# Patient Record
Sex: Male | Born: 1975
Health system: Southern US, Community
[De-identification: ages and names within clinical notes are randomized; demographics above are authoritative.]

## PROBLEM LIST (undated history)

## (undated) DIAGNOSIS — K219 Gastro-esophageal reflux disease without esophagitis: Secondary | ICD-10-CM

## (undated) DIAGNOSIS — F419 Anxiety disorder, unspecified: Secondary | ICD-10-CM

## (undated) DIAGNOSIS — R569 Unspecified convulsions: Secondary | ICD-10-CM

## (undated) DIAGNOSIS — F32A Depression, unspecified: Secondary | ICD-10-CM

## (undated) DIAGNOSIS — F329 Major depressive disorder, single episode, unspecified: Secondary | ICD-10-CM

## (undated) HISTORY — DX: Depression, unspecified: F32.A

## (undated) HISTORY — DX: Major depressive disorder, single episode, unspecified: F32.9

## (undated) HISTORY — DX: Gastro-esophageal reflux disease without esophagitis: K21.9

## (undated) HISTORY — DX: Anxiety disorder, unspecified: F41.9

---

## 1995-07-04 HISTORY — PX: FOOT SURGERY: SHX648

## 2012-07-22 ENCOUNTER — Ambulatory Visit: Payer: BC Managed Care – PPO | Admitting: Emergency Medicine

## 2012-07-22 VITALS — BP 111/77 | HR 103 | Temp 97.9°F | Resp 18 | Ht 68.5 in | Wt 223.4 lb

## 2012-07-22 DIAGNOSIS — Z20828 Contact with and (suspected) exposure to other viral communicable diseases: Secondary | ICD-10-CM

## 2012-07-22 DIAGNOSIS — F411 Generalized anxiety disorder: Secondary | ICD-10-CM

## 2012-07-22 DIAGNOSIS — F419 Anxiety disorder, unspecified: Secondary | ICD-10-CM

## 2012-07-22 MED ORDER — TEMAZEPAM 30 MG PO CAPS: 30.0000 mg | ORAL_CAPSULE | Freq: Every evening | ORAL | Status: DC | PRN

## 2012-07-22 MED ORDER — OSELTAMIVIR PHOSPHATE 75 MG PO CAPS: 75.0000 mg | ORAL_CAPSULE | Freq: Two times a day (BID) | ORAL | Status: DC

## 2012-07-22 NOTE — Patient Instructions (Addendum)

## 2012-07-22 NOTE — Progress Notes (Signed)
[facility] [address] [place] [address] [phone]  Date:  07/22/2012   Name:  [name] [name]   DOB:  [birth year]   MRN:  [id number]  PCP:  No primary provider on file.    Chief Complaint: Insomnia   History of Present Illness:  [name] [name] is a 37 y.o. very pleasant male patient who presents with the following:  Wife was treated for influenza today and he was advised to receive prophylaxis with tamiflu.  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  Difficulty falling asleep.  There is no problem list on file for this patient.   History reviewed. No pertinent past medical history.  History reviewed. No pertinent past surgical history.  History  Substance Use Topics  . Smoking status: Current Some Day Smoker  . Smokeless tobacco: Never Used  . Alcohol Use: Yes     Comment: social    No family history on file.  Not on File  Medication list has been reviewed and updated.  No current outpatient prescriptions on file prior to visit.    Review of Systems:  As per HPI, otherwise negative.    Physical Examination: Filed Vitals:   07/22/12 2047  BP: 111/77  Pulse: 103  Temp: 97.9 F (36.6 C)  Resp: 18   Filed Vitals:   07/22/12 2047  Height: 5' 8.5" (1.74 m)  Weight: 223 lb 6.4 oz (101.334 kg)   Body mass index is 33.47 kg/(m^2). Ideal Body Weight: Weight in (lb) to have BMI = 25: 166.5    GEN: WDWN, NAD, Non-toxic, Alert & Oriented x 3 HEENT: Atraumatic, Normocephalic.  Ears and Nose: No external deformity. EXTR: No clubbing/cyanosis/edema NEURO: Normal gait.  PSYCH: Normally interactive. Conversant. Not depressed or anxious appearing.  Calm demeanor.     Assessment and Plan: Insomnia Flu exposure restoril tamiflu  Carmelina Dane, MD

## 2013-03-17 ENCOUNTER — Other Ambulatory Visit: Payer: Self-pay | Admitting: Emergency Medicine

## 2013-03-21 ENCOUNTER — Telehealth: Payer: Self-pay

## 2013-03-21 NOTE — Telephone Encounter (Signed)
Patient advised.

## 2013-03-21 NOTE — Telephone Encounter (Signed)
Pt called Monday he said that he is needing refill on his sleeping pills bc the prescription he did have ran out pharmacy is [facility] [facility] [address] pt Call back number is [phone]

## 2013-03-21 NOTE — Telephone Encounter (Signed)
Refill request was denied because he needs to be seen.  Last seen in January.

## 2013-03-31 ENCOUNTER — Emergency Department (HOSPITAL_COMMUNITY)
Admission: EM | Admit: 2013-03-31 | Discharge: 2013-03-31 | Disposition: A | Discharge status: Home / Self Care | Payer: BC Managed Care – PPO | Attending: Emergency Medicine | Admitting: Emergency Medicine

## 2013-03-31 ENCOUNTER — Emergency Department (HOSPITAL_COMMUNITY): Payer: BC Managed Care – PPO

## 2013-03-31 ENCOUNTER — Encounter (HOSPITAL_COMMUNITY): Payer: Self-pay | Admitting: Emergency Medicine

## 2013-03-31 DIAGNOSIS — Z79899 Other long term (current) drug therapy: Secondary | ICD-10-CM | POA: Insufficient documentation

## 2013-03-31 DIAGNOSIS — F17200 Nicotine dependence, unspecified, uncomplicated: Secondary | ICD-10-CM | POA: Insufficient documentation

## 2013-03-31 DIAGNOSIS — R110 Nausea: Secondary | ICD-10-CM | POA: Insufficient documentation

## 2013-03-31 DIAGNOSIS — R1031 Right lower quadrant pain: Secondary | ICD-10-CM

## 2013-03-31 DIAGNOSIS — N509 Disorder of male genital organs, unspecified: Secondary | ICD-10-CM | POA: Insufficient documentation

## 2013-03-31 DIAGNOSIS — R109 Unspecified abdominal pain: Secondary | ICD-10-CM | POA: Insufficient documentation

## 2013-03-31 LAB — BASIC METABOLIC PANEL
BUN: 12 mg/dL (ref 6–23)
Calcium: 10 mg/dL (ref 8.4–10.5)
Chloride: 99 mEq/L (ref 96–112)
Creatinine, Ser: 0.94 mg/dL (ref 0.50–1.35)
GFR calc Af Amer: >90 mL/min (ref >90)
Glucose, Bld: 114 mg/dL — ABNORMAL HIGH (ref 70–99)

## 2013-03-31 LAB — CBC WITH DIFFERENTIAL/PLATELET
Basophils Absolute: 0 10*3/uL (ref 0.0–0.1)
Basophils Relative: 0 % (ref 0–1)
Eosinophils Absolute: 0.1 10*3/uL (ref 0.0–0.7)
Eosinophils Relative: 1 % (ref 0–5)
HCT: 44.6 % (ref 39.0–52.0)
Lymphocytes Relative: 14 % (ref 12–46)
MCHC: 34.1 g/dL (ref 30.0–36.0)
Monocytes Absolute: 0.9 10*3/uL (ref 0.1–1.0)
Neutro Abs: 10.4 10*3/uL — ABNORMAL HIGH (ref 1.7–7.7)
Platelets: 179 10*3/uL (ref 150–400)
RDW: 12.8 % (ref 11.5–15.5)
WBC: 13.2 10*3/uL — ABNORMAL HIGH (ref 4.0–10.5)

## 2013-03-31 LAB — URINALYSIS, ROUTINE W REFLEX MICROSCOPIC
Bilirubin Urine: NEGATIVE
Ketones, ur: NEGATIVE mg/dL
Leukocytes, UA: NEGATIVE
Nitrite: NEGATIVE
Protein, ur: NEGATIVE mg/dL
Urobilinogen, UA: 0.2 mg/dL (ref 0.0–1.0)
pH: 6.5 (ref 5.0–8.0)

## 2013-03-31 MED ORDER — KETOROLAC TROMETHAMINE 30 MG/ML IJ SOLN
30.0000 mg | Freq: Once | INTRAMUSCULAR | Status: AC
  Administered 2013-03-31: 30 mg via INTRAVENOUS
  Filled 2013-03-31: qty 1

## 2013-03-31 MED ORDER — OXYCODONE-ACETAMINOPHEN 5-325 MG PO TABS
1.0000 | ORAL_TABLET | Freq: Once | ORAL | Status: AC
  Administered 2013-03-31: 1 via ORAL
  Filled 2013-03-31: qty 1

## 2013-03-31 MED ORDER — OXYCODONE-ACETAMINOPHEN 5-325 MG PO TABS: 1.0000 | ORAL_TABLET | ORAL | Status: DC | PRN

## 2013-03-31 MED ORDER — CYCLOBENZAPRINE HCL 10 MG PO TABS: 10.0000 mg | ORAL_TABLET | Freq: Two times a day (BID) | ORAL | Status: DC | PRN

## 2013-03-31 MED ORDER — IBUPROFEN 600 MG PO TABS: 600.0000 mg | ORAL_TABLET | Freq: Three times a day (TID) | ORAL | Status: DC | PRN

## 2013-03-31 MED ORDER — SODIUM CHLORIDE 0.9 % IV SOLN
1000.0000 mL | INTRAVENOUS | Status: DC
  Administered 2013-03-31: 1000 mL via INTRAVENOUS

## 2013-03-31 MED ORDER — MORPHINE SULFATE 4 MG/ML IJ SOLN
6.0000 mg | Freq: Once | INTRAMUSCULAR | Status: AC
  Administered 2013-03-31: 6 mg via INTRAVENOUS
  Filled 2013-03-31: qty 2

## 2013-03-31 MED ORDER — ONDANSETRON HCL 4 MG/2ML IJ SOLN
4.0000 mg | Freq: Once | INTRAMUSCULAR | Status: AC
  Administered 2013-03-31: 4 mg via INTRAVENOUS
  Filled 2013-03-31: qty 2

## 2013-03-31 MED ORDER — DIAZEPAM 5 MG PO TABS
5.0000 mg | ORAL_TABLET | Freq: Once | ORAL | Status: AC
  Administered 2013-03-31: 5 mg via ORAL
  Filled 2013-03-31: qty 1

## 2013-03-31 MED ORDER — HYDROMORPHONE HCL PF 1 MG/ML IJ SOLN
1.0000 mg | Freq: Once | INTRAMUSCULAR | Status: AC
  Administered 2013-03-31: 1 mg via INTRAVENOUS
  Filled 2013-03-31: qty 1

## 2013-03-31 MED ORDER — SODIUM CHLORIDE 0.9 % IV SOLN
1000.0000 mL | Freq: Once | INTRAVENOUS | Status: AC
  Administered 2013-03-31: 1000 mL via INTRAVENOUS

## 2013-03-31 NOTE — ED Notes (Addendum)
Patient states that he began feeling groin pain at 0000 this am, patient went to sleep after intercourse with his wife, patient woke up at 0300 with right sided groin pain 10/10. Denies flank pain, denies heavy lifting this weekend. Patient denies dysuria, states he has been urinating at night more frequently for the past 2 months.

## 2013-03-31 NOTE — ED Notes (Signed)
Bed: ZO10 Expected date:  Expected time:  Means of arrival:  Comments: ems- leg pain, urinary frequency

## 2013-03-31 NOTE — Progress Notes (Addendum)
   CARE MANAGEMENT ED NOTE 03/31/2013  Patient:  [name] [name]   Account Number:  [id number]  Date Initiated:  03/31/2013  Documentation initiated by:  Edd Arbour  Subjective/Objective Assessment:   37 yr old male [employer] No pcp listed in EPIC     Subjective/Objective Assessment Detail:     Action/Plan:   Action/Plan Detail:   CM spoke with pt who confirmed no pcp   Anticipated DC Date:  03/31/2013     Status Recommendation to Physician:   Result of Recommendation:    Other ED Services  Consult Working Plan    DC Planning Services  Other  Outpatient Services - Pt will follow up  PCP issues    Choice offered to / List presented to:            Status of service:  Completed, signed off  ED Comments:   ED Comments Detail:  [facility] ED CM spoke with pt on how to obtain an in network pcp with insurance coverage via the customer service number or web site Cm reviewed ED level of care for crisis/emergent services and community pcp level of care to manage continuous or chronic medical concerns.  The pt voiced understanding CM encouraged pt and discussed pt's responsibility to verify with pt's insurance carrier that any recommended medical provider offered by any emergency room or a hospital provider is within the carrier's network. The pt voiced understanding

## 2013-03-31 NOTE — ED Provider Notes (Signed)
CSN: [id number]     Arrival date & time 03/31/13  0806 History   First MD Initiated Contact with Patient 03/31/13 [phone]     Chief Complaint  Patient presents with  . Groin Pain    HPI Patient reports severe right groin and right testicular pain that began this morning.  He became nauseated because of the pain.  She's never had pain like this before.  No pain with urination burning with urination.  No lower abdominal pain.  He denies right flank pain.  No history kidney stones.  He does report oral intercourse last night but no abnormal ejaculation.  No blood in his ejaculate.  No other complaints at that time.  He does state that he awoke several hours later with severe pain down his right groin and right thigh region.  He reports his pain is worse with walking and his pain is worse with flexion of his right hip.  He also reports the pain is worse with palpation of his right testicle and right groin region.  No known history of inguinal hernias.  Spasmodic to severe in severity at this time.  No fevers or chills or   History reviewed. No pertinent past medical history. History reviewed. No pertinent past surgical history. History reviewed. No pertinent family history. History  Substance Use Topics  . Smoking status: Current Some Day Smoker  . Smokeless tobacco: Never Used  . Alcohol Use: Yes     Comment: social    Review of Systems  All other systems reviewed and are negative.    Allergies  Review of patient's allergies indicates no known allergies.  Home Medications   Current Outpatient Rx  Name  Route  Sig  Dispense  Refill  . MELATONIN PO   Oral   Take 60 mg by mouth at bedtime.         Marland Kitchen omeprazole (PRILOSEC) 40 MG capsule   Oral   Take 40 mg by mouth daily as needed (hearburn).         . cyclobenzaprine (FLEXERIL) 10 MG tablet   Oral   Take 1 tablet (10 mg total) by mouth 2 (two) times daily as needed for muscle spasms.   20 tablet   0   . ibuprofen  (ADVIL,MOTRIN) 600 MG tablet   Oral   Take 1 tablet (600 mg total) by mouth every 8 (eight) hours as needed for pain.   15 tablet   0   . oxyCODONE-acetaminophen (PERCOCET/ROXICET) 5-325 MG per tablet   Oral   Take 1 tablet by mouth every 4 (four) hours as needed for pain.   20 tablet   0    BP 103/89  Pulse 74  Temp(Src) 98.9 F (37.2 C)  Resp 24  SpO2 100% Physical Exam  Nursing note and vitals reviewed. Constitutional: He is oriented to person, place, and time. He appears well-developed and well-nourished.  HENT:  Head: Normocephalic and atraumatic.  Eyes: EOM are normal.  Neck: Normal range of motion.  Cardiovascular: Normal rate, regular rhythm, normal heart sounds and intact distal pulses.   Pulmonary/Chest: Effort normal and breath sounds normal. No respiratory distress.  Abdominal: Soft. He exhibits no distension. There is no tenderness.  Genitourinary:  Mild tenderness of his right testicle without swelling.  No overlying scrotal changes.  No inguinal hernias present on examination.  Patient was exquisite tenderness of his right medial thigh with tenderness along the sartorius and gracilis muscles.  Pain at the insertion of the  screw still is in his right suprapubic region.  No right thigh erythema or crepitus.  No perineal issues noted.  Musculoskeletal: Normal range of motion.  Neurological: He is alert and oriented to person, place, and time.  Skin: Skin is warm and dry.  Psychiatric: He has a normal mood and affect. Judgment normal.    ED Course  Procedures (including critical care time) Labs Review Labs Reviewed  BASIC METABOLIC PANEL - Abnormal; Notable for the following:    Glucose, Bld 114 (*)    All other components within normal limits  CBC WITH DIFFERENTIAL - Abnormal; Notable for the following:    WBC 13.2 (*)    Neutrophils Relative % 79 (*)    Neutro Abs 10.4 (*)    All other components within normal limits  URINALYSIS, ROUTINE W REFLEX  MICROSCOPIC   Imaging Review US Scrotum  03/31/2013   *RADIOLOGY REPORT*  Clinical Data:  37 year old male with groin pain, right greater than left  SCROTAL ULTRASOUND DOPPLER ULTRASOUND OF THE TESTICLES  Technique: Complete ultrasound examination of the testicles, epididymis, and other scrotal structures was performed.  Color and spectral Doppler ultrasound were also utilized to evaluate blood flow to the testicles.  Comparison:  None  Findings:  Right testis:  The right testicle is normal in echogenicity and size measuring 5.5 x 2.7 x 3.1 cm.  No focal testicular abnormalities are noted.  Normal color Doppler flow and arterial/venous waveforms are noted.  Left testis:  The left testicle is normal in echogenicity and size measuring 5 x 2.4 x 3.1 cm.  No focal testicular abnormalities are noted.  Normal color Doppler flow and arterial/venous waveforms are noted.  Right epididymis:  Normal  Left epididymis:  A 4 x 5 mm epididymal cyst/spermatocele is noted.  Hydrocele:  Absent  Varicocele:  Absent  Pulsed Doppler interrogation of both testes demonstrates low resistance flow bilaterally.  IMPRESSION: Unremarkable scrotal ultrasound.  No evidence of testicular torsion or focal testicular abnormality.   Original Report Authenticated By: Harmon Pier, M.D.   [place] Art/ven Flow Abd Pelv Doppler  03/31/2013   *RADIOLOGY REPORT*  Clinical Data:  37 year old male with groin pain, right greater than left  SCROTAL ULTRASOUND DOPPLER ULTRASOUND OF THE TESTICLES  Technique: Complete ultrasound examination of the testicles, epididymis, and other scrotal structures was performed.  Color and spectral Doppler ultrasound were also utilized to evaluate blood flow to the testicles.  Comparison:  None  Findings:  Right testis:  The right testicle is normal in echogenicity and size measuring 5.5 x 2.7 x 3.1 cm.  No focal testicular abnormalities are noted.  Normal color Doppler flow and arterial/venous waveforms are noted.  Left  testis:  The left testicle is normal in echogenicity and size measuring 5 x 2.4 x 3.1 cm.  No focal testicular abnormalities are noted.  Normal color Doppler flow and arterial/venous waveforms are noted.  Right epididymis:  Normal  Left epididymis:  A 4 x 5 mm epididymal cyst/spermatocele is noted.  Hydrocele:  Absent  Varicocele:  Absent  Pulsed Doppler interrogation of both testes demonstrates low resistance flow bilaterally.  IMPRESSION: Unremarkable scrotal ultrasound.  No evidence of testicular torsion or focal testicular abnormality.   Original Report Authenticated By: Harmon Pier, M.D.  I personally reviewed the imaging tests through PACS system I reviewed available ER/hospitalization records through the EMR   MDM   1. Deep groin pain, right    Patient was treated in the emergency department.  Ultrasound demonstrates  a normal right testicle.  No inguinal hernias present.  Doubt ureteral stone.  Normal pulses in right foot.  Doubt DVT.  I examined the patient on 3 different occasions to evaluate his right groin right testicular region including in the room with his significant other.  It seems as though this is more related to sartorius her Kristalose pulled muscle with significant pain with movement of his right medial thigh.  Doubt septic arthritis.  Patient understands return to the ER for new or worsening symptoms.  Orthopedic referral.  Pain significantly improved in emergency department    Lyanne Co, MD 03/31/13 1348

## 2013-03-31 NOTE — ED Notes (Addendum)
Patient's brother, ED: [phone]. If after 1700 discharge, send patient to [address] [place].

## 2013-09-12 ENCOUNTER — Encounter: Payer: Self-pay | Admitting: Internal Medicine

## 2013-09-16 ENCOUNTER — Encounter: Payer: Self-pay | Admitting: Internal Medicine

## 2013-10-03 ENCOUNTER — Ambulatory Visit: Payer: BC Managed Care – PPO | Admitting: Internal Medicine

## 2013-10-07 ENCOUNTER — Ambulatory Visit: Payer: BC Managed Care – PPO | Admitting: Internal Medicine

## 2013-10-07 DIAGNOSIS — Z0289 Encounter for other administrative examinations: Secondary | ICD-10-CM

## 2014-03-17 ENCOUNTER — Other Ambulatory Visit (INDEPENDENT_AMBULATORY_CARE_PROVIDER_SITE_OTHER): Payer: BC Managed Care – PPO

## 2014-03-17 ENCOUNTER — Encounter: Payer: Self-pay | Admitting: Internal Medicine

## 2014-03-17 ENCOUNTER — Ambulatory Visit (INDEPENDENT_AMBULATORY_CARE_PROVIDER_SITE_OTHER): Payer: BC Managed Care – PPO | Admitting: Internal Medicine

## 2014-03-17 VITALS — BP 118/82 | HR 77 | Temp 98.6°F | Resp 22 | Ht 69.0 in | Wt 221.4 lb

## 2014-03-17 DIAGNOSIS — R209 Unspecified disturbances of skin sensation: Secondary | ICD-10-CM

## 2014-03-17 DIAGNOSIS — N528 Other male erectile dysfunction: Secondary | ICD-10-CM

## 2014-03-17 DIAGNOSIS — R200 Anesthesia of skin: Secondary | ICD-10-CM | POA: Insufficient documentation

## 2014-03-17 DIAGNOSIS — G4700 Insomnia, unspecified: Secondary | ICD-10-CM | POA: Insufficient documentation

## 2014-03-17 DIAGNOSIS — G5600 Carpal tunnel syndrome, unspecified upper limb: Secondary | ICD-10-CM | POA: Insufficient documentation

## 2014-03-17 DIAGNOSIS — G5603 Carpal tunnel syndrome, bilateral upper limbs: Secondary | ICD-10-CM

## 2014-03-17 DIAGNOSIS — N529 Male erectile dysfunction, unspecified: Secondary | ICD-10-CM | POA: Insufficient documentation

## 2014-03-17 LAB — HEMOGLOBIN A1C: Hgb A1c MFr Bld: 5.9 % (ref 4.6–6.5)

## 2014-03-17 MED ORDER — ZOLPIDEM TARTRATE 5 MG PO TABS: 5.0000 mg | ORAL_TABLET | Freq: Every evening | ORAL | Status: DC | PRN

## 2014-03-17 MED ORDER — SILDENAFIL CITRATE 100 MG PO TABS: 50.0000 mg | ORAL_TABLET | Freq: Every day | ORAL | Status: DC | PRN
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
## 2014-03-17 NOTE — Assessment & Plan Note (Signed)
Closest patient can describe is numbness and may be from restless leg syndrome. He is very insistent that he is worried about blood flow with family history of stroke. Will check ABI but reassured him that he is not likely to have problems.

## 2014-03-17 NOTE — Progress Notes (Signed)
Pre visit review using our clinic review tool, if applicable. No additional management support is needed unless otherwise documented below in the visit note. 

## 2014-03-17 NOTE — Assessment & Plan Note (Addendum)
Likely psychogenic in nature. He does not always have problems with erections. Will rx viagra as I think this will help his mind to know that he has help if he needs it.
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
## 2014-03-17 NOTE — Patient Instructions (Signed)
We will check a test on you to measure the blood flow to your legs. We will also check your blood for diabetes.   We will give you a prescription for ambien to help you to sleep and for viagra to try if it is not too expensive.   Work on portion control to help lose about 5-10 pounds.   Serving Sizes What we call a serving size today is larger than it was in the past. A 1950s fast-food burger contained little more than 1 oz of meat, and a soft drink was 8 oz (1 cup). Today, a "quarter pounder" burger is at least 4 times that amount, and a 32 or 64 oz drink is not uncommon. A possible guide for eating when trying to lose weight is to eat about half as much as you normally do. Some estimates of serving sizes are:  1 Dairy serving:Individual container of yogurt (8 oz) or piece of cheese the size of your thumb (1 oz).  1 Grain serving: 1 slice of bread or  cup pasta.  1 Meat serving: The size of a deck of cards (3 oz).  1 Fruit serving: cup canned fruit or 1 medium fruit.  1 Vegetable serving:  cup of cooked or canned vegetables.  1 Fat serving:The size of 4 stacked dimes. Experts suggest spending 1 or 2 days measuring food portions you commonly eat. This will give you better practice at estimating serving sizes, and will also show whether you are eating an appropriate amount of food to meet your weight goals. If you find that you are eating more than you thought, try measuring your food for a few days so you can "reprogram" yourself to learn what makes a healthy portion for you. SUGGESTIONS FOR CONTROL  In restaurants, share entrees, or ask the waiter to put half the entre in a box or bag before you even touch it.  Order lunch-sized portions. Many restaurants serve 4 to 6 oz of meat at lunch, compared with 8 to 10 oz at dinner.  Split dessert or skip it all together. Have a piece of fruit when you get home.  At home, use smaller plates and bowls. It will look as if you are eating  more.  Plate your food in the kitchen rather than serving it "family style" at the table.  Wait 20 to 30 minutes before taking seconds. This is how long it takes your brain to recognize that you are full.  Check food labels for serving sizes. Eat 1 serving only.  Use measuring cups and spoons to see proper serving sizes.  Buy smaller packages of candy, popcorn, and snacks.  Avoid eating directly out of the bag or carton.  While eating half as much, exercise twice as much. Park further away from the mall, take the stairs instead of the escalator, and walk around your block. Losing weight is a slow, difficult process. It takes long-lasting lifestyle changes. You can make gradual changes over time so they become habits. Look to friends and family to support the healthy changes you are making. Avoid fad diets since they are often only temporary weight loss solutions. Document Released: 03/18/2003 Document Revised: 09/11/2011 Document Reviewed: 09/16/2013 [facility] Patient Information 2015 [place], [place]. This information is not intended to replace advice given to you by your health care provider. Make sure you discuss any questions you have with your health care provider.

## 2014-03-17 NOTE — Assessment & Plan Note (Signed)
Difficulty initiating sleep and will rx ambien. He does not have awakening in the middle of the night so will be ideal agent.

## 2014-03-19 ENCOUNTER — Other Ambulatory Visit: Payer: Self-pay | Admitting: Internal Medicine

## 2014-03-19 DIAGNOSIS — R200 Anesthesia of skin: Secondary | ICD-10-CM

## 2014-04-01 ENCOUNTER — Telehealth: Payer: Self-pay | Admitting: *Deleted

## 2014-04-01 NOTE — Telephone Encounter (Signed)
Left msg on triage stating he has been taking 2 of the zolpidem 5 mg. Wanting to see will md send in 10 mg tablet. Also wanting results back from lab...Raechel Chute/lmb

## 2014-04-02 NOTE — Telephone Encounter (Signed)
3 attempts have been made to call and talk to him about results, will mail him results. Do not want him on the ambien 10 mg long term and would like him to try relaxation techniques with the 5 mg first.

## 2014-04-02 NOTE — Telephone Encounter (Signed)
Notified pt with md response.../lmb 

## 2014-09-15 ENCOUNTER — Ambulatory Visit: Payer: Self-pay | Admitting: Internal Medicine

## 2014-09-18 ENCOUNTER — Encounter: Payer: Self-pay | Admitting: Internal Medicine

## 2014-09-18 ENCOUNTER — Ambulatory Visit (INDEPENDENT_AMBULATORY_CARE_PROVIDER_SITE_OTHER): Payer: BLUE CROSS/BLUE SHIELD | Admitting: Internal Medicine

## 2014-09-18 VITALS — BP 110/82 | HR 71 | Temp 98.3°F | Resp 16 | Wt 216.0 lb

## 2014-09-18 DIAGNOSIS — B349 Viral infection, unspecified: Secondary | ICD-10-CM

## 2014-09-18 MED ORDER — TRIAMCINOLONE ACETONIDE 0.1 % EX CREA: 1.0000 "application " | TOPICAL_CREAM | Freq: Two times a day (BID) | CUTANEOUS | Status: DC

## 2014-09-18 MED ORDER — BENZONATATE 100 MG PO CAPS: 100.0000 mg | ORAL_CAPSULE | Freq: Two times a day (BID) | ORAL | Status: DC | PRN

## 2014-09-18 NOTE — Progress Notes (Signed)
Pre visit review using our clinic review tool, if applicable. No additional management support is needed unless otherwise documented below in the visit note. 

## 2014-09-18 NOTE — Patient Instructions (Signed)
We will send in the medicine for cough called tessalon perles which help to stop the brain from coughing. You can take 1 pill up to 3 times per day and not get drowsy.   If this is just a virus you should be feeling better in about 1 week. If it is mono it may take up to 1 month to clear.   We have sent in a cream for the rash, you can use it on the armpit and the groin folds, do not use it on the private area or under the sack as it is a strong medicine and can mess with your hormone levels.   We have given you a sample and savings card for the viagra.

## 2014-09-22 DIAGNOSIS — B349 Viral infection, unspecified: Secondary | ICD-10-CM | POA: Insufficient documentation

## 2014-09-22 NOTE — Assessment & Plan Note (Signed)
We talked about the fact that there is no treatment for mono but would tell us about how long he may experience symptoms. He declines screening. Tessalon perles for cough. If no improvement in 2 weeks likely mono. Given information about supportive care information. No rx as viral illness. Tylenol or ibuprofen for fevers at home (if having).

## 2014-09-22 NOTE — Progress Notes (Signed)
   Subjective:    Patient ID: [name] RankinsChris [name], male    DOB: 10-Aug-1975, 39 y.o.   MRN: [id number]  HPI The patient is here today for an acute visit for viral illness. His daughter was diagnosed with mono last week and he is concerned that he has same. He has been feeling fatigue and muscle aches the last 5 days. He has mild nasal congestion and non-productive cough. Denies fevers although he has had chills. Not getting any better. Has not tried anything over the counter. This is keeping him from work the last 3 days and he rates it moderate.   Review of Systems  Constitutional: Positive for chills, activity change and fatigue. Negative for fever and appetite change.  HENT: Positive for congestion and rhinorrhea. Negative for ear discharge, ear pain, facial swelling, sinus pressure, sore throat and trouble swallowing.   Respiratory: Positive for cough. Negative for chest tightness, shortness of breath and wheezing.   Cardiovascular: Negative for chest pain, palpitations and leg swelling.  Gastrointestinal: Negative.   Neurological: Negative.       Objective:   Physical Exam  Constitutional: He appears well-developed and well-nourished.  HENT:  Head: Normocephalic and atraumatic.  Minimal clear drainage in the throat, turbinates with mild redness no crusting.   Eyes: EOM are normal.  Cardiovascular: Normal rate and regular rhythm.   Pulmonary/Chest: Effort normal and breath sounds normal. No respiratory distress. He has no wheezes. He has no rales.  Lymphadenopathy:    He has no cervical adenopathy.   Filed Vitals:   09/18/14 1043  BP: 110/82  Pulse: 71  Temp: 98.3 F (36.8 C)  TempSrc: Oral  Resp: 16  Weight: 216 lb (97.977 kg)  SpO2: 98%      Assessment & Plan:

## 2014-10-26 ENCOUNTER — Telehealth: Payer: Self-pay | Admitting: Internal Medicine

## 2014-10-26 NOTE — Telephone Encounter (Signed)
Pt called and said that he thought Dr Dorise HissKollar was suppose to refill his   zolpidem (AMBIEN) 5 MG tablet [[id number]][[id number]]      Can he get a refill on this ?     Best number [phone]

## 2014-10-27 MED ORDER — ZOLPIDEM TARTRATE 5 MG PO TABS: 5.0000 mg | ORAL_TABLET | Freq: Every evening | ORAL | Status: DC | PRN

## 2014-10-27 NOTE — Telephone Encounter (Signed)
Printed please fax.  

## 2014-10-27 NOTE — Telephone Encounter (Signed)
Faxed

## 2015-02-12 ENCOUNTER — Ambulatory Visit: Payer: BLUE CROSS/BLUE SHIELD | Admitting: Podiatry

## 2015-05-04 ENCOUNTER — Ambulatory Visit: Payer: BLUE CROSS/BLUE SHIELD | Admitting: Internal Medicine

## 2015-05-05 ENCOUNTER — Other Ambulatory Visit (INDEPENDENT_AMBULATORY_CARE_PROVIDER_SITE_OTHER): Payer: BLUE CROSS/BLUE SHIELD

## 2015-05-05 ENCOUNTER — Other Ambulatory Visit: Payer: Self-pay | Admitting: Internal Medicine

## 2015-05-05 ENCOUNTER — Ambulatory Visit: Payer: BLUE CROSS/BLUE SHIELD | Admitting: Internal Medicine

## 2015-05-05 DIAGNOSIS — Z0000 Encounter for general adult medical examination without abnormal findings: Secondary | ICD-10-CM | POA: Diagnosis not present

## 2015-05-05 DIAGNOSIS — Z0289 Encounter for other administrative examinations: Secondary | ICD-10-CM

## 2015-05-05 DIAGNOSIS — R7989 Other specified abnormal findings of blood chemistry: Secondary | ICD-10-CM

## 2015-05-05 LAB — COMPREHENSIVE METABOLIC PANEL
ALT: 26 U/L (ref 0–53)
AST: 21 U/L (ref 0–37)
Albumin: 4.2 g/dL (ref 3.5–5.2)
Alkaline Phosphatase: 74 U/L (ref 39–117)
BUN: 9 mg/dL (ref 6–23)
CHLORIDE: 104 meq/L (ref 96–112)
CO2: 29 meq/L (ref 19–32)
CREATININE: 0.92 mg/dL (ref 0.40–1.50)
Calcium: 9.8 mg/dL (ref 8.4–10.5)
GFR: 97.03 mL/min (ref >60.00)
GLUCOSE: 92 mg/dL (ref 70–99)
POTASSIUM: 4.1 meq/L (ref 3.5–5.1)
SODIUM: 138 meq/L (ref 135–145)
Total Bilirubin: 0.4 mg/dL (ref 0.2–1.2)
Total Protein: 7.3 g/dL (ref 6.0–8.3)

## 2015-05-05 LAB — LIPID PANEL
CHOL/HDL RATIO: 5
Cholesterol: 196 mg/dL (ref 0–200)
HDL: 39.4 mg/dL (ref >39.00)
NONHDL: 156.66
Triglycerides: 364 mg/dL — ABNORMAL HIGH (ref 0.0–149.0)
VLDL: 72.8 mg/dL — AB (ref 0.0–40.0)

## 2015-05-05 LAB — HEMOGLOBIN A1C: HEMOGLOBIN A1C: 5.7 % (ref 4.6–6.5)

## 2015-05-05 LAB — LDL CHOLESTEROL, DIRECT: Direct LDL: 99 mg/dL

## 2015-05-05 MED ORDER — ZOLPIDEM TARTRATE 5 MG PO TABS: 5.0000 mg | ORAL_TABLET | Freq: Every evening | ORAL | Status: DC | PRN

## 2015-06-29 ENCOUNTER — Encounter: Payer: Self-pay | Admitting: Internal Medicine

## 2015-06-29 ENCOUNTER — Ambulatory Visit (INDEPENDENT_AMBULATORY_CARE_PROVIDER_SITE_OTHER): Payer: BLUE CROSS/BLUE SHIELD | Admitting: Internal Medicine

## 2015-06-29 VITALS — BP 140/90 | HR 89 | Temp 98.4°F | Resp 14 | Ht 68.5 in | Wt 215.0 lb

## 2015-06-29 DIAGNOSIS — N50812 Left testicular pain: Secondary | ICD-10-CM | POA: Diagnosis not present

## 2015-06-29 DIAGNOSIS — B369 Superficial mycosis, unspecified: Secondary | ICD-10-CM | POA: Insufficient documentation

## 2015-06-29 MED ORDER — TRIAMCINOLONE ACETONIDE 0.1 % EX CREA: 1.0000 "application " | TOPICAL_CREAM | Freq: Two times a day (BID) | CUTANEOUS | Status: DC

## 2015-06-29 MED ORDER — ZOLPIDEM TARTRATE 5 MG PO TABS: 5.0000 mg | ORAL_TABLET | Freq: Every evening | ORAL | Status: DC | PRN

## 2015-06-29 MED ORDER — FLUCONAZOLE 150 MG PO TABS: 150.0000 mg | ORAL_TABLET | ORAL | Status: DC

## 2015-06-29 NOTE — Assessment & Plan Note (Signed)
At this time no indication for further imaging. If pain persists then will get scrotal US. No indication for STD screening as no new partners and no discharge and no inguinal LAD. Likely the lump is epididymus. Both testicles normal and no signs of strangulation.

## 2015-06-29 NOTE — Assessment & Plan Note (Signed)
Rx for fluconazole for the rash. Advised to keep moisture away with cornstarch at clothing lines during vigorous activity or warm weather.

## 2015-06-29 NOTE — Patient Instructions (Signed)
We have sent in the pill for the rash called diflucan. Take 1 and if the rash is not gone in 3 days take the second one. If the rash goes away keep the second pill and you can take it if the rash returns.   The spot is likely a variation of normal and unless the pain persists or the spot grows we do not need to do anything further.

## 2015-06-29 NOTE — Progress Notes (Signed)
   Subjective:    Patient ID: [name] RankinsChris [name], male    DOB: 1975/08/01, 39 y.o.   MRN: [id number]  HPI The patient is a 39 YO man coming in with 2 acute concerns. First is the rash on his backside has returned. He is itching some and redness. No fevers or chills. Is out of the cream although this did work okay. Has also taken a pill in the past for it which worked well.  Second concern is a lump by his left testicle. Some mild pain in it every once in a while. No recent injury to the area, some in the past. No fevers or chills. No new sexual partners. No skin change or penile pain or discharge. Has not grown, has not tried anything for it.   Review of Systems  Constitutional: Negative for fever, activity change, appetite change and fatigue.  Respiratory: Negative for cough, chest tightness, shortness of breath and wheezing.   Cardiovascular: Negative for chest pain, palpitations and leg swelling.  Gastrointestinal: Negative for abdominal pain, diarrhea, constipation and abdominal distention.  Genitourinary: Positive for testicular pain. Negative for dysuria, urgency, frequency, decreased urine volume, discharge, penile swelling, scrotal swelling, difficulty urinating, genital sores and penile pain.  Skin: Positive for rash.  Psychiatric/Behavioral: Positive for sleep disturbance. Negative for suicidal ideas and self-injury. The patient is not nervous/anxious.       Objective:   Physical Exam  Constitutional: He is oriented to person, place, and time. He appears well-developed and well-nourished.  HENT:  Head: Normocephalic and atraumatic.  Eyes: EOM are normal.  Cardiovascular: Normal rate and regular rhythm.   Pulmonary/Chest: Effort normal and breath sounds normal. No respiratory distress. He has no wheezes. He has no rales.  Genitourinary: Penis normal. No penile tenderness.  Penis normal appearing without lesion or discharge. Bilateral testicles normal and no skin changes to the scrotum.  Small lump behind the left testicle which is mobile and non-tender  Neurological: He is alert and oriented to person, place, and time. Coordination normal.  Skin:  Fungal skin infection left buttock with some satellite lesions.    Filed Vitals:   06/29/15 1531  BP: 140/90  Pulse: 89  Temp: 98.4 F (36.9 C)  TempSrc: Oral  Resp: 14  Height: 5' 8.5" (1.74 m)  Weight: 215 lb (97.523 kg)  SpO2: 98%      Assessment & Plan:

## 2015-06-29 NOTE — Progress Notes (Signed)
Pre visit review using our clinic review tool, if applicable. No additional management support is needed unless otherwise documented below in the visit note. 

## 2015-12-06 ENCOUNTER — Other Ambulatory Visit: Payer: Self-pay | Admitting: Internal Medicine

## 2015-12-06 NOTE — Telephone Encounter (Signed)
Sent to pharmacy 

## 2015-12-21 ENCOUNTER — Ambulatory Visit: Payer: BLUE CROSS/BLUE SHIELD | Admitting: Internal Medicine

## 2015-12-23 ENCOUNTER — Ambulatory Visit (INDEPENDENT_AMBULATORY_CARE_PROVIDER_SITE_OTHER): Payer: BLUE CROSS/BLUE SHIELD | Admitting: Internal Medicine

## 2015-12-23 ENCOUNTER — Encounter: Payer: Self-pay | Admitting: Internal Medicine

## 2015-12-23 VITALS — BP 124/94 | HR 76 | Temp 98.1°F | Resp 12 | Ht 68.0 in | Wt 213.0 lb

## 2015-12-23 DIAGNOSIS — G4700 Insomnia, unspecified: Secondary | ICD-10-CM | POA: Diagnosis not present

## 2015-12-23 DIAGNOSIS — N528 Other male erectile dysfunction: Secondary | ICD-10-CM

## 2015-12-23 MED ORDER — SILDENAFIL CITRATE 100 MG PO TABS: 50.0000 mg | ORAL_TABLET | Freq: Every day | ORAL | Status: DC | PRN

## 2015-12-23 MED ORDER — ZOLPIDEM TARTRATE 5 MG PO TABS: 5.0000 mg | ORAL_TABLET | Freq: Every evening | ORAL | Status: DC | PRN

## 2015-12-23 NOTE — Progress Notes (Signed)
Pre visit review using our clinic review tool, if applicable. No additional management support is needed unless otherwise documented below in the visit note. 

## 2015-12-23 NOTE — Patient Instructions (Signed)
We have sent in the refill of the viagra and the ambien.   The labs are normal and you are healthy.   Good luck with the business!

## 2015-12-24 NOTE — Progress Notes (Signed)
   Subjective:    Patient ID: [name] RankinsChris [name], male    DOB: Feb 08, 1976, 40 y.o.   MRN: [id number]  HPI The patient is a 40 YO man coming in for follow up of his insomnia. Still doing well with his Rem[employer]lerambien and is trying not to use nightly. He has been taking a part of a pill some nights. 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 No new problems or concerns. Denies side effect from medication.   Review of Systems  Constitutional: Negative for fever, activity change, appetite change and fatigue.  Respiratory: Negative for cough, chest tightness, shortness of breath and wheezing.   Cardiovascular: Negative for chest pain, palpitations and leg swelling.  Gastrointestinal: Negative for abdominal pain, diarrhea, constipation and abdominal distention.  Psychiatric/Behavioral: Positive for sleep disturbance. Negative for suicidal ideas and self-injury. The patient is not nervous/anxious.       Objective:   Physical Exam  Constitutional: He is oriented to person, place, and time. He appears well-developed and well-nourished.  HENT:  Head: Normocephalic and atraumatic.  Eyes: EOM are normal.  Cardiovascular: Normal rate and regular rhythm.   Pulmonary/Chest: Effort normal and breath sounds normal. No respiratory distress. He has no wheezes. He has no rales.  Abdominal: Soft. He exhibits no distension. There is no tenderness.  Neurological: He is alert and oriented to person, place, and time. Coordination normal.  Skin: Skin is warm and dry.   Filed Vitals:   12/23/15 1550  BP: 124/94  Pulse: 76  Temp: 98.1 F (36.7 C)  TempSrc: Oral  Resp: 12  Height: 5\' 8"  (1.727 m)  Weight: 213 lb (96.616 kg)  SpO2: 98%      Assessment & Plan:

## 2015-12-24 NOTE — Assessment & Plan Note (Signed)
Doing well with less ambien and refill given today.

## 2015-12-24 NOTE — Assessment & Plan Note (Signed)
Refill of viagra

## 2016-01-18 DIAGNOSIS — S90851A Superficial foreign body, right foot, initial encounter: Secondary | ICD-10-CM | POA: Diagnosis not present

## 2016-03-23 ENCOUNTER — Ambulatory Visit (INDEPENDENT_AMBULATORY_CARE_PROVIDER_SITE_OTHER): Payer: BLUE CROSS/BLUE SHIELD | Admitting: Internal Medicine

## 2016-03-23 ENCOUNTER — Encounter: Payer: Self-pay | Admitting: Internal Medicine

## 2016-03-23 VITALS — BP 138/78 | HR 77 | Temp 98.3°F | Resp 12 | Ht 69.5 in | Wt 215.0 lb

## 2016-03-23 DIAGNOSIS — R109 Unspecified abdominal pain: Secondary | ICD-10-CM | POA: Diagnosis not present

## 2016-03-23 DIAGNOSIS — R21 Rash and other nonspecific skin eruption: Secondary | ICD-10-CM

## 2016-03-23 NOTE — Progress Notes (Signed)
   Subjective:    Patient ID: [name] RankinsChris Maffia, male    DOB: 1975/12/09, 40 y.o.   MRN: [id number]  HPI The patient is a 40 YO man coming in for left rib pain for about 1 week or so. Went to urgent care and treated for pulled muscle. They gave him naproxen and flexeril which did not help. The pain is worst with lying or sitting. Gets better with activity. Started in the left flank and radiates down to the left testicle. Denies urinary symptoms or swelling or sore. He was checked for bladder infection or blood in urine and this was not found. Overall the symptoms are gradually improving and only mild now but he is very concerned that they will worsen again since the cause was not found. He is able to take tylenol for the pain.   Review of Systems  Constitutional: Negative for activity change, appetite change, fatigue and fever.  Respiratory: Negative for cough, chest tightness, shortness of breath and wheezing.   Cardiovascular: Negative for chest pain, palpitations and leg swelling.  Gastrointestinal: Positive for abdominal pain. Negative for abdominal distention, anal bleeding, constipation, diarrhea and nausea.  Genitourinary: Positive for flank pain and testicular pain. Negative for decreased urine volume, difficulty urinating, discharge, dysuria, frequency, genital sores, penile pain, penile swelling, scrotal swelling and urgency.  Skin: Positive for rash.  Psychiatric/Behavioral: Positive for sleep disturbance. Negative for self-injury and suicidal ideas. The patient is not nervous/anxious.       Objective:   Physical Exam  Constitutional: He is oriented to person, place, and time. He appears well-developed and well-nourished.  HENT:  Head: Normocephalic and atraumatic.  Eyes: EOM are normal.  Cardiovascular: Normal rate and regular rhythm.   Pulmonary/Chest: Effort normal and breath sounds normal. No respiratory distress. He has no wheezes. He has no rales.  Abdominal: Soft. Bowel sounds  are normal. He exhibits no distension and no mass. There is no tenderness. There is no rebound and no guarding.  Left flank pain, no general abdomen pain or rebound or guarding.   Neurological: He is alert and oriented to person, place, and time. Coordination normal.  Skin: Skin is warm and dry.   Vitals:   03/23/16 0809  BP: 138/78  Pulse: 77  Resp: 12  Temp: 98.3 F (36.8 C)  TempSrc: Oral  SpO2: 98%  Weight: 215 lb (97.5 kg)  Height: 5' 9.5" (1.765 m)      Assessment & Plan:

## 2016-03-23 NOTE — Progress Notes (Signed)
Pre visit review using our clinic review tool, if applicable. No additional management support is needed unless otherwise documented below in the visit note. 

## 2016-03-23 NOTE — Patient Instructions (Signed)
We are going to check the CT scan of the stomach to check for kidney stones and hernias to find the cause.

## 2016-03-23 NOTE — Assessment & Plan Note (Signed)
Checking CT abdomen and pelvis without contrast to evaluate for kidney stones causing the symptoms. He did not have any hematuria in the urgent care (records not available for review) but this makes the most sense with the radiation to the left testicle with the pain.

## 2016-03-31 ENCOUNTER — Other Ambulatory Visit: Payer: BLUE CROSS/BLUE SHIELD

## 2016-05-11 ENCOUNTER — Encounter: Payer: Self-pay | Admitting: Internal Medicine

## 2016-05-11 ENCOUNTER — Ambulatory Visit (INDEPENDENT_AMBULATORY_CARE_PROVIDER_SITE_OTHER): Payer: BLUE CROSS/BLUE SHIELD | Admitting: Internal Medicine

## 2016-05-11 ENCOUNTER — Other Ambulatory Visit (INDEPENDENT_AMBULATORY_CARE_PROVIDER_SITE_OTHER): Payer: BLUE CROSS/BLUE SHIELD

## 2016-05-11 VITALS — BP 132/70 | HR 85 | Temp 98.8°F | Resp 16 | Ht 68.0 in | Wt 217.0 lb

## 2016-05-11 DIAGNOSIS — Z23 Encounter for immunization: Secondary | ICD-10-CM

## 2016-05-11 DIAGNOSIS — Z0000 Encounter for general adult medical examination without abnormal findings: Secondary | ICD-10-CM

## 2016-05-11 DIAGNOSIS — R7989 Other specified abnormal findings of blood chemistry: Secondary | ICD-10-CM

## 2016-05-11 LAB — COMPREHENSIVE METABOLIC PANEL
ALK PHOS: 71 U/L (ref 39–117)
ALT: 34 U/L (ref 0–53)
AST: 29 U/L (ref 0–37)
Albumin: 4.6 g/dL (ref 3.5–5.2)
BILIRUBIN TOTAL: 0.5 mg/dL (ref 0.2–1.2)
BUN: 12 mg/dL (ref 6–23)
CO2: 28 mEq/L (ref 19–32)
CREATININE: 1.1 mg/dL (ref 0.40–1.50)
Calcium: 9.9 mg/dL (ref 8.4–10.5)
Chloride: 105 mEq/L (ref 96–112)
GFR: 78.55 mL/min (ref >60.00)
Glucose, Bld: 92 mg/dL (ref 70–99)
Potassium: 4 mEq/L (ref 3.5–5.1)
Sodium: 140 mEq/L (ref 135–145)
Total Protein: 7.5 g/dL (ref 6.0–8.3)

## 2016-05-11 LAB — LIPID PANEL
Cholesterol: 213 mg/dL — ABNORMAL HIGH (ref 0–200)
HDL: 38.4 mg/dL — ABNORMAL LOW (ref >39.00)
Total CHOL/HDL Ratio: 6

## 2016-05-11 LAB — LDL CHOLESTEROL, DIRECT: LDL DIRECT: 115 mg/dL

## 2016-05-11 MED ORDER — TRIAMCINOLONE ACETONIDE 0.1 % EX CREA: 1.0000 "application " | TOPICAL_CREAM | Freq: Two times a day (BID) | CUTANEOUS | 0 refills | Status: DC

## 2016-05-11 NOTE — Progress Notes (Signed)
Pre visit review using our clinic review tool, if applicable. No additional management support is needed unless otherwise documented below in the visit note. 

## 2016-05-11 NOTE — Progress Notes (Signed)
   Subjective:    Patient ID: Donney RankinsChris Schue, male    DOB: 1975-10-17, 40 y.o.   MRN: [id number]  HPI The patient is a 40 YO man coming in for wellness. No new concerns.   [facility], social history reviewed and updated.   Review of Systems  Constitutional: Negative.   HENT: Negative.   Eyes: Negative.   Respiratory: Negative for cough, chest tightness and shortness of breath.   Cardiovascular: Negative for chest pain, palpitations and leg swelling.  Gastrointestinal: Negative for abdominal distention, abdominal pain, constipation, diarrhea, nausea and vomiting.  Musculoskeletal: Negative.   Skin: Negative.   Neurological: Negative.   Psychiatric/Behavioral: Negative.       Objective:   Physical Exam  Constitutional: He is oriented to person, place, and time. He appears well-developed and well-nourished.  HENT:  Head: Normocephalic and atraumatic.  Eyes: EOM are normal.  Neck: Normal range of motion.  Cardiovascular: Normal rate and regular rhythm.   Pulmonary/Chest: Effort normal and breath sounds normal. No respiratory distress. He has no wheezes. He has no rales.  Abdominal: Soft. Bowel sounds are normal. He exhibits no distension. There is no tenderness. There is no rebound.  Musculoskeletal: He exhibits no edema.  Neurological: He is alert and oriented to person, place, and time. Coordination normal.  Skin: Skin is warm and dry.  Psychiatric: He has a normal mood and affect.   Vitals:   05/11/16 1547  BP: 132/70  Pulse: 85  Resp: 16  Temp: 98.8 F (37.1 C)  TempSrc: Oral  SpO2: 98%  Weight: 217 lb (98.4 kg)  Height: 5\' 8"  (1.727 m)      Assessment & Plan:  Flu shot given at visit.

## 2016-05-11 NOTE — Patient Instructions (Addendum)
We have given you the flu shot and checking the blood work.  Health Maintenance, Male A healthy lifestyle and preventative care can promote health and wellness.  Maintain regular health, dental, and eye exams.  Eat a healthy diet. Foods like vegetables, fruits, whole grains, low-fat dairy products, and lean protein foods contain the nutrients you need and are low in calories. Decrease your intake of foods high in solid fats, added sugars, and salt. Get information about a proper diet from your health care provider, if necessary.  Regular physical exercise is one of the most important things you can do for your health. Most adults should get at least 150 minutes of moderate-intensity exercise (any activity that increases your heart rate and causes you to sweat) each week. In addition, most adults need muscle-strengthening exercises on 2 or more days a week.   Maintain a healthy weight. The body mass index (BMI) is a screening tool to identify possible weight problems. It provides an estimate of body fat based on height and weight. Your health care provider can find your BMI and can help you achieve or maintain a healthy weight. For males 20 years and older:  A BMI below 18.5 is considered underweight.  A BMI of 18.5 to 24.9 is normal.  A BMI of 25 to 29.9 is considered overweight.  A BMI of 30 and above is considered obese.  Maintain normal blood lipids and cholesterol by exercising and minimizing your intake of saturated fat. Eat a balanced diet with plenty of fruits and vegetables. Blood tests for lipids and cholesterol should begin at age 40 and be repeated every 5 years. If your lipid or cholesterol levels are high, you are over age 40, or you are at high risk for heart disease, you may need your cholesterol levels checked more frequently.Ongoing high lipid and cholesterol levels should be treated with medicines if diet and exercise are not working.  If you smoke, find out from your  health care provider how to quit. If you do not use tobacco, do not start.  Lung cancer screening is recommended for adults aged 55-80 years who are at high risk for developing lung cancer because of a history of smoking. A yearly low-dose CT scan of the lungs is recommended for people who have at least a 30-pack-year history of smoking and are current smokers or have quit within the past 15 years. A pack year of smoking is smoking an average of 1 pack of cigarettes a day for 1 year (for example, a 30-pack-year history of smoking could mean smoking 1 pack a day for 30 years or 2 packs a day for 15 years). Yearly screening should continue until the smoker has stopped smoking for at least 15 years. Yearly screening should be stopped for people who develop a health problem that would prevent them from having lung cancer treatment.  If you choose to drink alcohol, do not have more than 2 drinks per day. One drink is considered to be 12 oz (360 mL) of beer, 5 oz (150 mL) of wine, or 1.5 oz (45 mL) of liquor.  Avoid the use of street drugs. Do not share needles with anyone. Ask for help if you need support or instructions about stopping the use of drugs.  High blood pressure causes heart disease and increases the risk of stroke. High blood pressure is more likely to develop in:  People who have blood pressure in the end of the normal range (100-139/85-89 mm Hg).  People who are overweight or obese.  People who are African American.  If you are 14-73 years of age, have your blood pressure checked every 3-5 years. If you are 50 years of age or older, have your blood pressure checked every year. You should have your blood pressure measured twice--once when you are at a hospital or clinic, and once when you are not at a hospital or clinic. Record the average of the two measurements. To check your blood pressure when you are not at a hospital or clinic, you can use:  An automated blood pressure machine at a  pharmacy.  A home blood pressure monitor.  If you are 69-22 years old, ask your health care provider if you should take aspirin to prevent heart disease.  Diabetes screening involves taking a blood sample to check your fasting blood sugar level. This should be done once every 3 years after age 16 if you are at a normal weight and without risk factors for diabetes. Testing should be considered at a younger age or be carried out more frequently if you are overweight and have at least 1 risk factor for diabetes.  Colorectal cancer can be detected and often prevented. Most routine colorectal cancer screening begins at the age of 44 and continues through age 73. However, your health care provider may recommend screening at an earlier age if you have risk factors for colon cancer. On a yearly basis, your health care provider may provide home test kits to check for hidden blood in the stool. A small camera at the end of a tube may be used to directly examine the colon (sigmoidoscopy or colonoscopy) to detect the earliest forms of colorectal cancer. Talk to your health care provider about this at age 24 when routine screening begins. A direct exam of the colon should be repeated every 5-10 years through age 74, unless early forms of precancerous polyps or small growths are found.  People who are at an increased risk for hepatitis B should be screened for this virus. You are considered at high risk for hepatitis B if:  You were born in a country where hepatitis B occurs often. Talk with your health care provider about which countries are considered high risk.  Your parents were born in a high-risk country and you have not received a shot to protect against hepatitis B (hepatitis B vaccine).  You have HIV or AIDS.  You use needles to inject street drugs.  You live with, or have sex with, someone who has hepatitis B.  You are a man who has sex with other men (MSM).  You get hemodialysis  treatment.  You take certain medicines for conditions like cancer, organ transplantation, and autoimmune conditions.  Hepatitis C blood testing is recommended for all people born from [birth year] through 1965 and any individual with known risk factors for hepatitis C.  Healthy men should no longer receive prostate-specific antigen (PSA) blood tests as part of routine cancer screening. Talk to your health care provider about prostate cancer screening.  Testicular cancer screening is not recommended for adolescents or adult males who have no symptoms. Screening includes self-exam, a health care provider exam, and other screening tests. Consult with your health care provider about any symptoms you have or any concerns you have about testicular cancer.  Practice safe sex. Use condoms and avoid high-risk sexual practices to reduce the spread of sexually transmitted infections (STIs).  You should be screened for STIs, including gonorrhea and chlamydia if:  You are sexually active and are younger than 24 years.  You are older than 24 years, and your health care provider tells you that you are at risk for this type of infection.  Your sexual activity has changed since you were last screened, and you are at an increased risk for chlamydia or gonorrhea. Ask your health care provider if you are at risk.  If you are at risk of being infected with HIV, it is recommended that you take a prescription medicine daily to prevent HIV infection. This is called pre-exposure prophylaxis (PrEP). You are considered at risk if:  You are a man who has sex with other men (MSM).  You are a heterosexual man who is sexually active with multiple partners.  You take drugs by injection.  You are sexually active with a partner who has HIV.  Talk with your health care provider about whether you are at high risk of being infected with HIV. If you choose to begin PrEP, you should first be tested for HIV. You should then be tested  every 3 months for as long as you are taking PrEP.  Use sunscreen. Apply sunscreen liberally and repeatedly throughout the day. You should seek shade when your shadow is shorter than you. Protect yourself by wearing long sleeves, pants, a wide-brimmed hat, and sunglasses year round whenever you are outdoors.  Tell your health care provider of new moles or changes in moles, especially if there is a change in shape or color. Also, tell your health care provider if a mole is larger than the size of a pencil eraser.  A one-time screening for abdominal aortic aneurysm (AAA) and surgical repair of large AAAs by ultrasound is recommended for men aged 44-75 years who are current or former smokers.  Stay current with your vaccines (immunizations).   This information is not intended to replace advice given to you by your health care provider. Make sure you discuss any questions you have with your health care provider.   Document Released: 12/16/2007 Document Revised: 07/10/2014 Document Reviewed: 11/14/2010 Elsevier Interactive Patient Education [employer].

## 2016-05-12 DIAGNOSIS — Z0000 Encounter for general adult medical examination without abnormal findings: Secondary | ICD-10-CM | POA: Insufficient documentation

## 2016-05-12 NOTE — Assessment & Plan Note (Signed)
Given flu shot and checking labs, counseled about need for regular exercise. Counseled on sun safety and the dangers of distracted driving. Given screening recommendations. Tdap declined.

## 2016-05-20 ENCOUNTER — Other Ambulatory Visit: Payer: Self-pay | Admitting: Internal Medicine

## 2016-05-22 NOTE — Telephone Encounter (Signed)
Sent to pharmacy 

## 2016-06-21 ENCOUNTER — Telehealth: Payer: Self-pay | Admitting: Emergency Medicine

## 2016-06-21 ENCOUNTER — Other Ambulatory Visit: Payer: Self-pay | Admitting: *Deleted

## 2016-06-21 DIAGNOSIS — R21 Rash and other nonspecific skin eruption: Secondary | ICD-10-CM

## 2016-06-21 MED ORDER — FLUCONAZOLE 150 MG PO TABS
150.0000 mg | ORAL_TABLET | ORAL | 0 refills | Status: DC
Start: 2016-06-21 — End: 2017-03-12

## 2016-06-21 NOTE — Telephone Encounter (Signed)
Pt called and stated he needs a referral put in for the skin problem he has. You put one in a while back and he didn't use it but now needs it again. Please advise thanks.

## 2016-06-21 NOTE — Telephone Encounter (Signed)
Pt left msg on triage stating the rash has return back, and would like to get a refill on the pill MD gave him, and also would like the referral to see dermatologist. Per chart back 06/2015 MD rx fluconazole for rash on his back, and place referral to see dermatologist. Notified pt sent refill to [facility], and placed referral for dermatologist again...Raechel Chute/lmb

## 2016-06-30 ENCOUNTER — Encounter: Payer: Self-pay | Admitting: Internal Medicine

## 2016-07-13 ENCOUNTER — Telehealth: Payer: Self-pay | Admitting: Internal Medicine

## 2016-07-13 NOTE — Telephone Encounter (Signed)
Rx faxed

## 2016-07-13 NOTE — Telephone Encounter (Signed)
Patient states he uses [facility] on [address]

## 2016-07-27 DIAGNOSIS — L309 Dermatitis, unspecified: Secondary | ICD-10-CM | POA: Diagnosis not present

## 2016-12-27 DIAGNOSIS — F331 Major depressive disorder, recurrent, moderate: Secondary | ICD-10-CM | POA: Diagnosis not present

## 2016-12-27 DIAGNOSIS — F418 Other specified anxiety disorders: Secondary | ICD-10-CM | POA: Diagnosis not present

## 2016-12-31 DIAGNOSIS — R229 Localized swelling, mass and lump, unspecified: Secondary | ICD-10-CM | POA: Diagnosis not present

## 2016-12-31 DIAGNOSIS — R05 Cough: Secondary | ICD-10-CM | POA: Diagnosis not present

## 2017-01-31 ENCOUNTER — Other Ambulatory Visit: Payer: Self-pay | Admitting: Internal Medicine

## 2017-01-31 NOTE — Telephone Encounter (Signed)
Last dispensed 01/09/2017 with 15 tabs for 25 days

## 2017-02-27 ENCOUNTER — Ambulatory Visit: Payer: BLUE CROSS/BLUE SHIELD | Admitting: Internal Medicine

## 2017-03-12 ENCOUNTER — Encounter: Payer: Self-pay | Admitting: Internal Medicine

## 2017-03-12 ENCOUNTER — Ambulatory Visit (INDEPENDENT_AMBULATORY_CARE_PROVIDER_SITE_OTHER): Payer: BLUE CROSS/BLUE SHIELD | Admitting: Internal Medicine

## 2017-03-12 DIAGNOSIS — N528 Other male erectile dysfunction: Secondary | ICD-10-CM | POA: Diagnosis not present

## 2017-03-12 DIAGNOSIS — G4700 Insomnia, unspecified: Secondary | ICD-10-CM

## 2017-03-12 MED ORDER — SILDENAFIL CITRATE 20 MG PO TABS: 20.0000 mg | ORAL_TABLET | Freq: Three times a day (TID) | ORAL | 0 refills | Status: DC

## 2017-03-12 MED ORDER — ZOLPIDEM TARTRATE 5 MG PO TABS: 5.0000 mg | ORAL_TABLET | Freq: Every evening | ORAL | 3 refills | Status: DC | PRN

## 2017-03-12 NOTE — Patient Instructions (Signed)
We have given you the prescription for the generic viagra. Call around to see if they will sell it cheaper. Since it is a smaller dosage take 3-5 pills per time to work.

## 2017-03-13 NOTE — Progress Notes (Signed)
   Subjective:    Patient ID: [name] [name], male    DOB: May 20, 1976, 41 y.o.   MRN: [id number]  HPI The patient is a 41 YO man coming in for follow up of his sleep (is out of [place] and wants to get that refilled, taking 1/2 to 1 pill at night time to help him fall asleep, used to take only 1/2 pill daily but more stress in his life now, denies side effects, typically not able to fall asleep without), and his ED (cannot afford viagra and wonders if there are other options, typically has problems only rarely, sometimes halfway into sexual encounter, no problems with original erection, denies marital stress, life is very stressful right now).   Review of Systems  Constitutional: Negative.   Respiratory: Negative.   Cardiovascular: Negative.   Gastrointestinal: Negative.   Genitourinary:       ED  Musculoskeletal: Negative.   Skin: Negative.   Psychiatric/Behavioral: Positive for sleep disturbance. Negative for confusion, decreased concentration, dysphoric mood, self-injury and suicidal ideas.      Objective:   Physical Exam  Constitutional: He is oriented to person, place, and time. He appears well-developed and well-nourished.  HENT:  Head: Normocephalic and atraumatic.  Eyes: EOM are normal.  Neck: Normal range of motion.  Cardiovascular: Normal rate and regular rhythm.   Pulmonary/Chest: Effort normal and breath sounds normal. No respiratory distress. He has no wheezes. He has no rales.  Abdominal: Soft. Bowel sounds are normal. He exhibits no distension. There is no tenderness. There is no rebound.  Musculoskeletal: He exhibits no edema.  Neurological: He is alert and oriented to person, place, and time. Coordination normal.  Skin: Skin is warm and dry.  Psychiatric: He has a normal mood and affect.   Vitals:   03/12/17 1544  BP: 138/84  Pulse: 68  Temp: 98.2 F (36.8 C)  TempSrc: Oral  SpO2: 99%  Weight: 213 lb (96.6 kg)  Height: 5\' 8"  (1.727 m)      Assessment & Plan:

## 2017-03-13 NOTE — Assessment & Plan Note (Signed)
Rx for Hewlett-Packardambien refilled. He is reminded about the risk of long term dependence on [place] and he is okay with this. Also counseled about long term risk of increased falls and memory problems.

## 2017-03-13 NOTE — Assessment & Plan Note (Signed)
RX for sildenafil to see if he can find generic cheaper. Likely an issue related to stress rather than organic problem.

## 2017-05-14 ENCOUNTER — Encounter: Payer: Self-pay | Admitting: Internal Medicine

## 2017-05-14 ENCOUNTER — Other Ambulatory Visit (INDEPENDENT_AMBULATORY_CARE_PROVIDER_SITE_OTHER): Payer: BLUE CROSS/BLUE SHIELD

## 2017-05-14 ENCOUNTER — Ambulatory Visit (INDEPENDENT_AMBULATORY_CARE_PROVIDER_SITE_OTHER): Payer: BLUE CROSS/BLUE SHIELD | Admitting: Internal Medicine

## 2017-05-14 VITALS — BP 128/88 | HR 70 | Temp 98.4°F | Ht 68.0 in | Wt 216.0 lb

## 2017-05-14 DIAGNOSIS — K219 Gastro-esophageal reflux disease without esophagitis: Secondary | ICD-10-CM | POA: Diagnosis not present

## 2017-05-14 DIAGNOSIS — Z0000 Encounter for general adult medical examination without abnormal findings: Secondary | ICD-10-CM

## 2017-05-14 DIAGNOSIS — Z23 Encounter for immunization: Secondary | ICD-10-CM | POA: Diagnosis not present

## 2017-05-14 DIAGNOSIS — R21 Rash and other nonspecific skin eruption: Secondary | ICD-10-CM | POA: Diagnosis not present

## 2017-05-14 LAB — CBC
HEMATOCRIT: 44.1 % (ref 39.0–52.0)
Hemoglobin: 15.1 g/dL (ref 13.0–17.0)
MCHC: 34.2 g/dL (ref 30.0–36.0)
MCV: 90.2 fl (ref 78.0–100.0)
PLATELETS: 209 10*3/uL (ref 150.0–400.0)
RBC: 4.89 Mil/uL (ref 4.22–5.81)
RDW: 13 % (ref 11.5–15.5)
WBC: 6.7 10*3/uL (ref 4.0–10.5)

## 2017-05-14 LAB — LIPID PANEL
CHOL/HDL RATIO: 5
CHOLESTEROL: 205 mg/dL — AB (ref 0–200)
HDL: 39.9 mg/dL (ref >39.00)

## 2017-05-14 LAB — COMPREHENSIVE METABOLIC PANEL
ALBUMIN: 4.3 g/dL (ref 3.5–5.2)
ALT: 26 U/L (ref 0–53)
AST: 21 U/L (ref 0–37)
Alkaline Phosphatase: 60 U/L (ref 39–117)
BUN: 9 mg/dL (ref 6–23)
CALCIUM: 9.7 mg/dL (ref 8.4–10.5)
CHLORIDE: 104 meq/L (ref 96–112)
CO2: 25 mEq/L (ref 19–32)
Creatinine, Ser: 0.93 mg/dL (ref 0.40–1.50)
GFR: 94.87 mL/min (ref >60.00)
Glucose, Bld: 97 mg/dL (ref 70–99)
POTASSIUM: 4.1 meq/L (ref 3.5–5.1)
Sodium: 138 mEq/L (ref 135–145)
Total Bilirubin: 0.5 mg/dL (ref 0.2–1.2)
Total Protein: 7.2 g/dL (ref 6.0–8.3)

## 2017-05-14 LAB — LDL CHOLESTEROL, DIRECT: Direct LDL: 106 mg/dL

## 2017-05-14 LAB — HEMOGLOBIN A1C: HEMOGLOBIN A1C: 5.7 % (ref 4.6–6.5)

## 2017-05-14 MED ORDER — OMEPRAZOLE 40 MG PO CPDR: 40.0000 mg | DELAYED_RELEASE_CAPSULE | Freq: Every day | ORAL | 3 refills | Status: DC

## 2017-05-14 NOTE — Assessment & Plan Note (Signed)
Referral to dermatology for the rash. He is not faithful about lotioning and some dermatitis on exam.

## 2017-05-14 NOTE — Patient Instructions (Signed)
We will check the labs today.  We have sent in the omeprazole to take daily.  We will get you in with another dermatologist.

## 2017-05-14 NOTE — Progress Notes (Signed)
   Subjective:    Patient ID: [name] [name], male    DOB: 14-Jul-1975, 41 y.o.   MRN: [id number]  HPI The patient is a 41 YO man coming in for acid reflux. He is taking omeprazole over the counter. He is wanting to get a prescription for this for cost savings. If he misses the medication he gets heartburn. He denies blood in stool. Also wants to see another dermatologist for his rash as the last one just told him to use more lotion.   Review of Systems  Constitutional: Negative.   Respiratory: Negative for cough, chest tightness and shortness of breath.   Cardiovascular: Negative for chest pain, palpitations and leg swelling.  Gastrointestinal: Negative for abdominal distention, abdominal pain, constipation, diarrhea, nausea and vomiting.       GERD  Musculoskeletal: Negative.   Skin: Positive for rash.  Neurological: Negative.       Objective:   Physical Exam  Constitutional: He is oriented to person, place, and time. He appears well-developed and well-nourished.  HENT:  Head: Normocephalic and atraumatic.  Eyes: EOM are normal.  Neck: Normal range of motion.  Cardiovascular: Normal rate and regular rhythm.  Pulmonary/Chest: Effort normal and breath sounds normal. No respiratory distress. He has no wheezes. He has no rales.  Abdominal: Soft. Bowel sounds are normal. He exhibits no distension. There is no tenderness. There is no rebound.  Musculoskeletal: He exhibits no edema.  Neurological: He is alert and oriented to person, place, and time. Coordination normal.  Skin: Skin is warm and dry.   Vitals:   05/14/17 0940  BP: 128/88  Pulse: 70  Temp: 98.4 F (36.9 C)  TempSrc: Oral  SpO2: 99%  Weight: 216 lb (98 kg)  Height: 5\' 8"  (1.727 m)      Assessment & Plan:  Flu shot given at visit

## 2017-05-14 NOTE — Assessment & Plan Note (Signed)
Rx for omeprazole sent in today. If resolution of symptoms no further intervention needed. If persistent symptoms may need referral to GI.

## 2017-05-21 ENCOUNTER — Other Ambulatory Visit: Payer: Self-pay | Admitting: Internal Medicine

## 2017-05-21 MED ORDER — FENOFIBRATE 160 MG PO TABS: 160.0000 mg | ORAL_TABLET | Freq: Every day | ORAL | 3 refills | Status: DC

## 2017-06-07 DIAGNOSIS — B355 Tinea imbricata: Secondary | ICD-10-CM | POA: Diagnosis not present

## 2017-06-22 ENCOUNTER — Ambulatory Visit (INDEPENDENT_AMBULATORY_CARE_PROVIDER_SITE_OTHER): Payer: BLUE CROSS/BLUE SHIELD | Admitting: Internal Medicine

## 2017-06-22 ENCOUNTER — Encounter: Payer: Self-pay | Admitting: Internal Medicine

## 2017-06-22 DIAGNOSIS — G4700 Insomnia, unspecified: Secondary | ICD-10-CM

## 2017-06-22 DIAGNOSIS — E781 Pure hyperglyceridemia: Secondary | ICD-10-CM | POA: Diagnosis not present

## 2017-06-22 DIAGNOSIS — N528 Other male erectile dysfunction: Secondary | ICD-10-CM | POA: Diagnosis not present

## 2017-06-22 MED ORDER — ZOLPIDEM TARTRATE 10 MG PO TABS: 10.0000 mg | ORAL_TABLET | Freq: Every evening | ORAL | 5 refills | Status: DC | PRN

## 2017-06-22 NOTE — Progress Notes (Signed)
   Subjective:    Patient ID: [name] [name], male    DOB: 1975/09/03, 41 y.o.   MRN: [id number]  HPI The patient is a 41 YO man coming in for multiple concerns about his recent high triglycerides. He was prescribed fenofibrate and has started taking this medication. Denies any problems with the medication. He is scared that this means he will have heart attack or stroke. He is also concerned about his sleeping. Pharmacy has switched generic of his Remus Lofflerambien and he has been taking some more of it (usually 1/2 pill now 1 pill) nightly for sleeping. This seems to be doing about the same. He is under more stress right now from finances and family. His other concern is about testosterone levels, he is still having intermittent problems with ED which are unchanged. He is now feeling too like he has low energy and motivation as well as some decrease in muscles. He is trying to eat healthier, denies exercise routine but is active. Weight is down about 4 pounds since last visit.   Review of Systems  Constitutional: Negative.   HENT: Negative.   Eyes: Negative.   Respiratory: Negative for cough, chest tightness and shortness of breath.   Cardiovascular: Negative for chest pain, palpitations and leg swelling.  Gastrointestinal: Negative for abdominal distention, abdominal pain, constipation, diarrhea, nausea and vomiting.  Musculoskeletal: Negative.   Skin: Negative.   Neurological: Negative.   Psychiatric/Behavioral: Negative.       Objective:   Physical Exam  Constitutional: He is oriented to person, place, and time. He appears well-developed and well-nourished.  HENT:  Head: Normocephalic and atraumatic.  Eyes: EOM are normal.  Neck: Normal range of motion.  Cardiovascular: Normal rate and regular rhythm.  Pulmonary/Chest: Effort normal and breath sounds normal. No respiratory distress. He has no wheezes. He has no rales.  Abdominal: Soft. Bowel sounds are normal. He exhibits no distension. There is  no tenderness. There is no rebound.  Musculoskeletal: He exhibits no edema.  Neurological: He is alert and oriented to person, place, and time. Coordination normal.  Skin: Skin is warm and dry.  Psychiatric: He has a normal mood and affect.   Vitals:   06/22/17 1024  BP: 122/84  Pulse: 76  Temp: 98.5 F (36.9 C)  TempSrc: Oral  SpO2: 98%  Weight: 212 lb (96.2 kg)  Height: 5\' 8"  (1.727 m)      Assessment & Plan:

## 2017-06-22 NOTE — Assessment & Plan Note (Signed)
He is taking fenofibrate and will recheck lipids in about 3 months. He is informed that today is too early to recheck the levels. He is making positive changes to diet and down about 4 pounds.

## 2017-06-22 NOTE — Assessment & Plan Note (Addendum)
He will check testosterone levels at next visit, we talked about the need for levels prior to 9AM for accuracy. Suspect some of the etiology is emotional rather than physical.

## 2017-06-22 NOTE — Assessment & Plan Note (Signed)
Will increase ambien to 10 mg daily, he has tried other agents in the past without success. If this does not work we will need to change agents. He is aware of the risk of dependency and increased risk of falls.

## 2017-06-22 NOTE — Patient Instructions (Signed)

## 2017-08-23 ENCOUNTER — Ambulatory Visit (INDEPENDENT_AMBULATORY_CARE_PROVIDER_SITE_OTHER): Payer: BLUE CROSS/BLUE SHIELD | Admitting: Internal Medicine

## 2017-08-23 ENCOUNTER — Encounter: Payer: Self-pay | Admitting: Internal Medicine

## 2017-08-23 DIAGNOSIS — F39 Unspecified mood [affective] disorder: Secondary | ICD-10-CM

## 2017-08-23 MED ORDER — BUSPIRONE HCL 5 MG PO TABS: 5.0000 mg | ORAL_TABLET | Freq: Two times a day (BID) | ORAL | 1 refills | Status: DC | PRN

## 2017-08-23 NOTE — Progress Notes (Signed)
   Subjective:    Patient ID: [name] [name], male    DOB: [birth year], 42 y.o.   MRN: [id number]  HPI The patient is a 42 YO man coming in for mood problems. He does use marijuana everyday for pain and for mood problems. He feels that he has bipolar and gets mood swings for no reason for at least 10 years. He is scared of taking a medicine that might change his personality. Gets some spells of depression which might last for 1 day or 1 week. He also gets elevated moods sometimes which he manages with the marijuana. Denies changes recently to amount smoking or diet or medications. No other illicits. Denies SI/HI. Is not currently feeling up or down. Some volatility in his mood which he uses marijuana to treat. Had a temper in younger years which marijuana has helped.   Review of Systems  Constitutional: Negative.   Respiratory: Negative for cough, chest tightness and shortness of breath.   Cardiovascular: Negative for chest pain, palpitations and leg swelling.  Gastrointestinal: Negative for abdominal distention, abdominal pain, constipation, diarrhea, nausea and vomiting.  Musculoskeletal: Negative.   Skin: Negative.   Neurological: Negative.   Psychiatric/Behavioral: Positive for agitation, dysphoric mood and sleep disturbance. Negative for behavioral problems, confusion, decreased concentration, hallucinations, self-injury and suicidal ideas. The patient is not nervous/anxious and is not hyperactive.       Objective:   Physical Exam  Constitutional: He is oriented to person, place, and time. He appears well-developed and well-nourished.  HENT:  Head: Normocephalic and atraumatic.  Eyes: EOM are normal.  Neck: Normal range of motion.  Cardiovascular: Normal rate and regular rhythm.  Pulmonary/Chest: Effort normal and breath sounds normal. No respiratory distress. He has no wheezes. He has no rales.  Abdominal: Soft. Bowel sounds are normal. He exhibits no distension. There is no tenderness.  There is no rebound.  Musculoskeletal: He exhibits no edema.  Neurological: He is alert and oriented to person, place, and time. Coordination normal.  Skin: Skin is warm and dry.  Psychiatric: He has a normal mood and affect. His behavior is normal.   Vitals:   08/23/17 1524  BP: 110/88  Pulse: 89  Temp: 98.3 F (36.8 C)  TempSrc: Oral  SpO2: 96%  Weight: 212 lb (96.2 kg)  Height: 5\' 8"  (1.727 m)      Assessment & Plan:

## 2017-08-23 NOTE — Patient Instructions (Signed)
We have sent in buspar to help if you get the depressed mood. You can take it up to twice per day but only as needed.

## 2017-08-24 DIAGNOSIS — F39 Unspecified mood [affective] disorder: Secondary | ICD-10-CM | POA: Insufficient documentation

## 2017-08-24 NOTE — Assessment & Plan Note (Signed)
Using marijuana to self medicate. Talked to him about how marijuana usage has shown to increase both depression and anxiety in certain cases and can cause mood disorder. He does not want to stop at this time. Rx for buspar to use for depression as needed or anxiety as needed. Advised to think about stopping. It is unclear if he truly has bipolar as he does not described protracted mania or depression but symptoms mostly lasting less than 1 day. This is not consistent with typical bipolar. He declines psychiatry at this time.

## 2017-09-25 ENCOUNTER — Other Ambulatory Visit: Payer: Self-pay

## 2017-09-25 ENCOUNTER — Telehealth: Payer: Self-pay

## 2017-09-25 MED ORDER — BUSPIRONE HCL 5 MG PO TABS: 5.0000 mg | ORAL_TABLET | Freq: Two times a day (BID) | ORAL | 1 refills | Status: DC | PRN

## 2017-09-25 NOTE — Telephone Encounter (Signed)
[facility] sent [facility] refill request for Buspirone. Please advise regarding refill.  Thanks

## 2017-09-25 NOTE — Telephone Encounter (Signed)
Okay to fill - thanks!

## 2017-11-19 ENCOUNTER — Ambulatory Visit (INDEPENDENT_AMBULATORY_CARE_PROVIDER_SITE_OTHER): Payer: BLUE CROSS/BLUE SHIELD | Admitting: Internal Medicine

## 2017-11-19 ENCOUNTER — Encounter: Payer: Self-pay | Admitting: Internal Medicine

## 2017-11-19 DIAGNOSIS — M25511 Pain in right shoulder: Secondary | ICD-10-CM | POA: Diagnosis not present

## 2017-11-19 MED ORDER — PREDNISONE 20 MG PO TABS: 40.0000 mg | ORAL_TABLET | Freq: Every day | ORAL | 0 refills | Status: DC

## 2017-11-19 MED ORDER — CYCLOBENZAPRINE HCL 5 MG PO TABS: 5.0000 mg | ORAL_TABLET | Freq: Three times a day (TID) | ORAL | 1 refills | Status: DC | PRN

## 2017-11-19 NOTE — Progress Notes (Signed)
   Subjective:    Patient ID: [name] [name], male    DOB: [birth year], 42 y.o.   MRN: [id number]  HPI The patient is a 42 YO man coming in for fall and shoulder injury. He was chasing a chicken and tripped and fell. He caught himself with his arm but hurt his shoulder. Happened about 3-4 days ago. Overall pain is stable since onset. Taking cbd oil at this time. Worried about possible rotator cuff injury. Does have some ROM limitation. No numbness or weakness.   Review of Systems  Constitutional: Positive for activity change. Negative for appetite change, fatigue, fever and unexpected weight change.  Respiratory: Negative.   Cardiovascular: Negative.   Musculoskeletal: Positive for arthralgias and myalgias. Negative for back pain and joint swelling.  Skin: Negative.   Neurological: Negative for syncope, weakness and numbness.      Objective:   Physical Exam  Constitutional: He is oriented to person, place, and time. He appears well-developed and well-nourished.  HENT:  Head: Normocephalic and atraumatic.  Eyes: EOM are normal.  Neck: Normal range of motion.  Cardiovascular: Normal rate and regular rhythm.  Pulmonary/Chest: Effort normal and breath sounds normal. No respiratory distress. He has no wheezes. He has no rales.  Abdominal: Soft.  Musculoskeletal: He exhibits tenderness. He exhibits no edema.  Pain in the shoulder joint, active ROM some decreased   Neurological: He is alert and oriented to person, place, and time. Coordination normal.  Skin: Skin is warm and dry.  Psychiatric: He has a normal mood and affect.   Vitals:   11/19/17 1548  BP: 128/88  Pulse: 65  Temp: 98 F (36.7 C)  TempSrc: Oral  SpO2: 96%  Weight: 217 lb (98.4 kg)  Height:  (1.727 m)      Assessment & Plan:

## 2017-11-19 NOTE — Patient Instructions (Addendum)
We have sent in prednisone to take 2 pills daily for 5 days.   Call back if not improving.

## 2017-11-20 DIAGNOSIS — M75100 Unspecified rotator cuff tear or rupture of unspecified shoulder, not specified as traumatic: Secondary | ICD-10-CM | POA: Insufficient documentation

## 2017-11-20 NOTE — Assessment & Plan Note (Signed)
Rx for prednisone burst. Asked to limit activity for 1 week or so. Can take otc meds for pain relief as needed. Currently using cbd oil which is effective. If no resolution needs to see sports medicine.

## 2017-11-27 ENCOUNTER — Ambulatory Visit: Payer: BLUE CROSS/BLUE SHIELD | Admitting: Internal Medicine

## 2017-11-27 ENCOUNTER — Telehealth: Payer: Self-pay

## 2017-11-27 ENCOUNTER — Other Ambulatory Visit: Payer: Self-pay | Admitting: Internal Medicine

## 2017-11-27 ENCOUNTER — Ambulatory Visit (INDEPENDENT_AMBULATORY_CARE_PROVIDER_SITE_OTHER)
Admission: RE | Admit: 2017-11-27 | Discharge: 2017-11-27 | Disposition: A | Discharge status: Home / Self Care | Payer: BLUE CROSS/BLUE SHIELD | Source: Ambulatory Visit | Attending: Internal Medicine | Admitting: Internal Medicine

## 2017-11-27 DIAGNOSIS — M25511 Pain in right shoulder: Secondary | ICD-10-CM

## 2017-11-27 DIAGNOSIS — S4991XA Unspecified injury of right shoulder and upper arm, initial encounter: Secondary | ICD-10-CM | POA: Diagnosis not present

## 2017-11-27 NOTE — Telephone Encounter (Signed)
Can take tylenol or aleve for pain. There is not much else which is stronger other than things with codeine.

## 2017-11-27 NOTE — Telephone Encounter (Signed)
Called patient to inform him x-ray was placed and that he could come in for that whenever he has time and that he can make an appointment with sports medicine after the results come in. Patient stated understanding but that he is in a lot of pain and the muscle relaxer he is taking is not cutting it he does not want anything with codeine but does not know what he can take to help or if something can be sent in. Please advise

## 2017-11-27 NOTE — Telephone Encounter (Signed)
Patient informed states he is going to get the X-ray and go from there on whether he will try something with codeine

## 2017-11-29 ENCOUNTER — Encounter: Payer: Self-pay | Admitting: Family Medicine

## 2017-11-29 ENCOUNTER — Ambulatory Visit (INDEPENDENT_AMBULATORY_CARE_PROVIDER_SITE_OTHER): Payer: BLUE CROSS/BLUE SHIELD | Admitting: Family Medicine

## 2017-11-29 DIAGNOSIS — M75101 Unspecified rotator cuff tear or rupture of right shoulder, not specified as traumatic: Secondary | ICD-10-CM | POA: Diagnosis not present

## 2017-11-29 MED ORDER — DICLOFENAC SODIUM 2 % TD SOLN: 1.0000 "application " | Freq: Two times a day (BID) | TRANSDERMAL | 3 refills | Status: DC

## 2017-11-29 MED ORDER — NAPROXEN-ESOMEPRAZOLE 500-20 MG PO TBEC: 1.0000 | DELAYED_RELEASE_TABLET | Freq: Two times a day (BID) | ORAL | 3 refills | Status: DC

## 2017-11-29 NOTE — Progress Notes (Addendum)
[name] [name] - 41 y.o. male MRN [id number]  Date of birth: [birth year]  SUBJECTIVE:  Including CC & ROS.  Chief Complaint  Patient presents with  . Right shoulder pain    [name] [name] is a 42 y.o. male that is presenting with right shoulder pain. He fell two weeks ago and landed with his arm outstretched to catch his fall. Pain is located on the lateral aspect and radiates posteriorly. Denies tingling or numbness. Pain is throbbing.  Worse with flexion and extension. Pain is worse when he raises his arm in abduction. He has been applying heat. He completed prednisone with some improvement. Denies having any prior problems with his shoulder. Works for himself.     Review of Systems  Constitutional: Negative for fever.  HENT: Negative for congestion.   Respiratory: Negative for cough.   Cardiovascular: Negative for chest pain.  Gastrointestinal: Negative for abdominal pain.  Musculoskeletal: Negative for joint swelling.  Skin: Negative for color change.  Neurological: Negative for weakness.  Hematological: Negative for adenopathy.  Psychiatric/Behavioral: Negative for behavioral problems.    HISTORY: Past Medical, Surgical, Social, and Family History Reviewed & Updated per EMR.   Pertinent Historical Findings include:  Past Medical History:  Diagnosis Date  . Anxiety   . Depression   . GERD (gastroesophageal reflux disease)     Past Surgical History:  Procedure Laterality Date  . FOOT SURGERY Left 1997   repaired tendon    No Known Allergies  Family History  Problem Relation Age of Onset  . Stroke Mother   . Hypertension Mother   . Arthritis Mother   . Heart disease Brother   . Stroke Maternal Grandmother   . Stroke Paternal Grandmother      Social History   Socioeconomic History  . Marital status: Married    Spouse name: Not on file  . Number of children: Not on file  . Years of education: Not on file  . Highest education level: Not on file  Occupational  History  . Not on file  Social Needs  . Financial resource strain: Not on file  . Food insecurity:    Worry: Not on file    Inability: Not on file  . Transportation needs:    Medical: Not on file    Non-medical: Not on file  Tobacco Use  . Smoking status: Former Smoker    Last attempt to quit: 07/03/2008    Years since quitting: 9.4  . Smokeless tobacco: Never Used  Substance and Sexual Activity  . Alcohol use: Yes    Comment: social  . Drug use: No  . Sexual activity: Not on file  Lifestyle  . Physical activity:    Days per week: Not on file    Minutes per session: Not on file  . Stress: Not on file  Relationships  . Social connections:    Talks on phone: Not on file    Gets together: Not on file    Attends religious service: Not on file    Active member of club or organization: Not on file    Attends meetings of clubs or organizations: Not on file    Relationship status: Not on file  . Intimate partner violence:    Fear of current or ex partner: Not on file    Emotionally abused: Not on file    Physically abused: Not on file    Forced sexual activity: Not on file  Other Topics Concern  . Not on file  Social History Narrative  . Not on file     PHYSICAL EXAM:  VS: BP 126/68 (BP Location: Left Arm, Patient Position: Sitting, Cuff Size: Normal)   Pulse 69   Temp 98.9 F (37.2 C) (Oral)   Ht  (1.727 m)   Wt 224 lb (101.6 kg)   SpO2 97%   BMI 34.06 kg/m  Physical Exam Gen: NAD, alert, cooperative with exam, well-appearing ENT: normal lips, normal nasal mucosa,  Eye: normal EOM, normal conjunctiva and lids CV:  no edema, +2 pedal pulses   Resp: no accessory muscle use, non-labored,  Skin: no rashes, no areas of induration  Neuro: normal tone, normal sensation to touch Psych:  normal insight, alert and oriented MSK:  Right Shoulder: Inspection reveals no abnormalities, atrophy or asymmetry. Palpation is normal with no tenderness over AC joint  ROM is  full in all planes. Normal ER  Normal strength to resistance with IR and ER  Pain Hawkin's tests, empty can sign. Speeds and Yergason's tests normal. Pain with Obrien's Neurovascularly intact   Limited ultrasound: right shoulder:  Normal appearing BT  Normal subscap  Supraspinatus with no definitive tear. Hypoechoic change superficial to the tendon  Normal infraspinatus   Summary: subacromial bursits.   Ultrasound and interpretation by Clare Gandy, MD   Aspiration/Injection Procedure Note [name] 1976-05-16  Procedure: Injection Indications: Right shoulder pain  Procedure Details Consent: Risks of procedure as well as the alternatives and risks of each were explained to the (patient/caregiver).  Consent for procedure obtained. Time Out: Verified patient identification, verified procedure, site/side was marked, verified correct patient position, special equipment/implants available, medications/allergies/relevent history reviewed, required imaging and test results available.  Performed.  The area was cleaned with iodine and alcohol swabs.    The right subacromial bursa was injected using 1 cc's of 40 mg Depomedrol and 4 cc's of 1% lidocaine with a 25 1 1/2" needle.  Ultrasound was used. Images were obtained in Transverse and Long views showing the injection.    A sterile dressing was applied.  Patient did tolerate procedure well.             ASSESSMENT & PLAN:   Rotator cuff syndrome Had a trauma as the source of his pain. Possible for small supraspinatus tear and bursitis is present. Possible for labral tear given mechanism.  - subacromial injection  - pennsaid and vimovo.  - counseled on HEP  - if no improvement consider PT vs MRI to evaluate labrum

## 2017-11-29 NOTE — Patient Instructions (Addendum)
Nice to meet you  Please try the exercises  Please follow up with me in 3 weeks.  Please try to ice the area.  Please try to avoid raising your arm above your shoulder level.

## 2017-11-29 NOTE — Assessment & Plan Note (Addendum)
Had a trauma as the source of his pain. Possible for small supraspinatus tear and bursitis is present. Possible for labral tear given mechanism.  - subacromial injection  - pennsaid and vimovo.  - counseled on HEP  - if no improvement consider PT vs MRI to evaluate labrum

## 2017-12-27 ENCOUNTER — Encounter: Payer: Self-pay | Admitting: Family Medicine

## 2017-12-27 ENCOUNTER — Ambulatory Visit (INDEPENDENT_AMBULATORY_CARE_PROVIDER_SITE_OTHER): Payer: BLUE CROSS/BLUE SHIELD | Admitting: Family Medicine

## 2017-12-27 VITALS — BP 132/84 | HR 76 | Ht 68.0 in | Wt 213.0 lb

## 2017-12-27 DIAGNOSIS — M75101 Unspecified rotator cuff tear or rupture of right shoulder, not specified as traumatic: Secondary | ICD-10-CM

## 2017-12-27 NOTE — Progress Notes (Signed)
[name] [name] - 42 y.o. male MRN [id number]  Date of birth: 1975/09/05  SUBJECTIVE:  Including CC & ROS.  Chief Complaint  Patient presents with  . Follow-up    [name] [name] is a 42 y.o. male that is here today for right shoulder pain follow up. He received an subacromial injection on 11/29/17. He states the pain improved, but does not feel fully better. Admits to decrease range of motion. Pain worsen when he raises his arm. Pain is located on the lateral and proximal aspect of his shoulder. He has been completing home exercises.      Review of Systems  Constitutional: Negative for fever.  HENT: Negative for congestion.   Respiratory: Negative for cough.   Cardiovascular: Negative for chest pain.  Gastrointestinal: Negative for abdominal pain.  Musculoskeletal: Negative for back pain.  Skin: Negative for color change.  Neurological: Negative for weakness.  Hematological: Negative for adenopathy.  Psychiatric/Behavioral: Negative for agitation.    HISTORY: Past Medical, Surgical, Social, and Family History Reviewed & Updated per EMR.   Pertinent Historical Findings include:  Past Medical History:  Diagnosis Date  . Anxiety   . Depression   . GERD (gastroesophageal reflux disease)     Past Surgical History:  Procedure Laterality Date  . FOOT SURGERY Left 1997   repaired tendon    No Known Allergies  Family History  Problem Relation Age of Onset  . Stroke Mother   . Hypertension Mother   . Arthritis Mother   . Heart disease Brother   . Stroke Maternal Grandmother   . Stroke Paternal Grandmother      Social History   Socioeconomic History  . Marital status: Married    Spouse name: Not on file  . Number of children: Not on file  . Years of education: Not on file  . Highest education level: Not on file  Occupational History  . Not on file  Social Needs  . Financial resource strain: Not on file  . Food insecurity:    Worry: Not on file    Inability: Not on  file  . Transportation needs:    Medical: Not on file    Non-medical: Not on file  Tobacco Use  . Smoking status: Former Smoker    Last attempt to quit: 07/03/2008    Years since quitting: 9.4  . Smokeless tobacco: Never Used  Substance and Sexual Activity  . Alcohol use: Yes    Comment: social  . Drug use: No  . Sexual activity: Not on file  Lifestyle  . Physical activity:    Days per week: Not on file    Minutes per session: Not on file  . Stress: Not on file  Relationships  . Social connections:    Talks on phone: Not on file    Gets together: Not on file    Attends religious service: Not on file    Active member of club or organization: Not on file    Attends meetings of clubs or organizations: Not on file    Relationship status: Not on file  . Intimate partner violence:    Fear of current or ex partner: Not on file    Emotionally abused: Not on file    Physically abused: Not on file    Forced sexual activity: Not on file  Other Topics Concern  . Not on file  Social History Narrative  . Not on file     PHYSICAL EXAM:  VS: BP 132/84 (BP  Location: Left Arm, Patient Position: Sitting, Cuff Size: Normal)   Pulse 76   Ht 5\' 8"  (1.727 m)   Wt 213 lb (96.6 kg)   SpO2 (!) 78%   BMI 32.39 kg/m  Physical Exam Gen: NAD, alert, cooperative with exam, well-appearing ENT: normal lips, normal nasal mucosa,  Eye: normal EOM, normal conjunctiva and lids CV:  no edema, +2 pedal pulses   Resp: no accessory muscle use, non-labored,   Skin: no rashes, no areas of induration  Neuro: normal tone, normal sensation to touch Psych:  normal insight, alert and oriented MSK:  Right Shoulder: Inspection reveals large effusion over the [facility] joint . TTp over [facility] joint  ROM is full in all planes. Rotator cuff strength normal throughout. Pain with Hawkin's tests, empty can sign. Speeds tests with pain. Pain with Obrien's Normal scapular function observed. No painful arc and no drop arm  sign. No apprehension sign Neurovascularly intact    Limited ultrasound: right shoulder:  Normal appearing BT  Normal appearing dynamic and static subscap  Normal appearing supraspinatus  AC joint with large effusion overlying   Summary: AC joint effusion to suggest possible sprain   Ultrasound and interpretation by Clare Gandy, MD          ASSESSMENT & PLAN:   Rotator cuff syndrome Pain is ongoing. Has a large effusion over the [facility] joint. Xray didn't demonstrate any AC separation. Possible for AC sprain vs effusion from the [facility] joint from labral problem - referral to PT  - continue exercises  - could consider an Gh injection vs MRI if no improvement

## 2017-12-27 NOTE — Patient Instructions (Signed)
Good to see you  Please try the physical therapy Please try to ice the shoulder  Please see me in back 4 weeks to see how you're doing.

## 2017-12-28 NOTE — Assessment & Plan Note (Signed)
Pain is ongoing. [facility] Xray didn't demonstrate any AC separation. Possible for AC sprain vs [facility] from labral problem - referral to PT  - continue exercises  - could consider an Gh injection vs MRI if no improvement

## 2018-01-08 ENCOUNTER — Other Ambulatory Visit: Payer: Self-pay | Admitting: Internal Medicine

## 2018-01-09 NOTE — Telephone Encounter (Signed)
Control database checked last refill: 12/14/2017 LOV: 12/27/17 with Jordan LikesSchmitz, Acute visit 11/19/2017

## 2018-01-16 ENCOUNTER — Encounter: Payer: Self-pay | Admitting: Family Medicine

## 2018-02-28 ENCOUNTER — Telehealth: Payer: Self-pay

## 2018-02-28 DIAGNOSIS — R21 Rash and other nonspecific skin eruption: Secondary | ICD-10-CM

## 2018-02-28 NOTE — Addendum Note (Signed)
Addended by: Hillard DankerRAWFORD, Shenia Alan A on: 02/28/2018 04:20 PM   Modules accepted: Orders

## 2018-02-28 NOTE — Telephone Encounter (Signed)
Copied from CRM [phone]. Topic: Referral - Request >> Feb 28, 2018  3:06 PM Terisa Starraylor, Brittany L wrote: Reason for CRM: Patient is requesting to see a dermatologist for a rash all over his body. He said this is an ongoing issue. He said that any doctor will be fine.

## 2018-02-28 NOTE — Telephone Encounter (Signed)
Referral placed.

## 2018-02-28 NOTE — Telephone Encounter (Signed)
Patient informed referral was placed  

## 2018-03-11 DIAGNOSIS — B355 Tinea imbricata: Secondary | ICD-10-CM | POA: Diagnosis not present

## 2018-05-13 ENCOUNTER — Other Ambulatory Visit: Payer: Self-pay | Admitting: Internal Medicine

## 2018-05-14 ENCOUNTER — Other Ambulatory Visit: Payer: Self-pay | Admitting: Internal Medicine

## 2018-05-14 NOTE — Telephone Encounter (Signed)
Control database checked last refill: 12/14/2017 LOV: acute 11/19/2017 BJY:NWGNOV:none

## 2018-06-08 ENCOUNTER — Other Ambulatory Visit: Payer: Self-pay | Admitting: Internal Medicine

## 2018-06-10 NOTE — Telephone Encounter (Signed)
Control database checked last refill: 12/14/2017 LOV: 06/22/2017 for insomnia NOV: none

## 2018-09-18 ENCOUNTER — Ambulatory Visit: Payer: BLUE CROSS/BLUE SHIELD | Admitting: Internal Medicine

## 2018-10-02 ENCOUNTER — Other Ambulatory Visit: Payer: Self-pay | Admitting: Internal Medicine

## 2018-10-02 NOTE — Telephone Encounter (Signed)
Ok to Rf? Last lipid checked 05/14/17.

## 2018-10-04 ENCOUNTER — Encounter: Payer: Self-pay | Admitting: Internal Medicine

## 2018-10-04 ENCOUNTER — Ambulatory Visit (INDEPENDENT_AMBULATORY_CARE_PROVIDER_SITE_OTHER): Payer: BLUE CROSS/BLUE SHIELD | Admitting: Internal Medicine

## 2018-10-04 DIAGNOSIS — N528 Other male erectile dysfunction: Secondary | ICD-10-CM | POA: Diagnosis not present

## 2018-10-04 DIAGNOSIS — G4700 Insomnia, unspecified: Secondary | ICD-10-CM | POA: Diagnosis not present

## 2018-10-04 DIAGNOSIS — F39 Unspecified mood [affective] disorder: Secondary | ICD-10-CM | POA: Diagnosis not present

## 2018-10-04 NOTE — Assessment & Plan Note (Signed)
He wants to wean off ambien and we discussed how [name] [name] tends to cause problems with stopping. He agrees that now is not a good time with the increased stress and will reassess at follow up. Refill until that time.

## 2018-10-04 NOTE — Assessment & Plan Note (Signed)
Referral to psych for evaluation for his request. Will continue buspar and talked about other coping skills in the meantime. No SI/HI. Does not have overt signs of mania or mania in the past.

## 2018-10-04 NOTE — Assessment & Plan Note (Signed)
Will do prostate exam at next visit. Does not sound like infection currently. Likely the mental state and stress is impacting ED.

## 2018-10-04 NOTE — Progress Notes (Signed)
Virtual Visit via Video Note  I connected with [name] [name] on 10/04/18 at  2:00 PM EDT by a video enabled telemedicine application and verified that I am speaking with the correct person using two identifiers.   I discussed the limitations of evaluation and management by telemedicine and the availability of in person appointments. The patient expressed understanding and agreed to proceed.  History of Present Illness: The patient is a 43 y.o. YO man presents for virtual visit for several concerns including ED/prostate issues (some ED hit and miss, able to have erections sometimes, denies pain with urination, denies problems with stream or starting and stopping, denies night time urination) and mental state (has been taking buspar recently to help with stress and anxiety, has been having more irritability lately, denies not sleeping recently, concern about bipolar and wants to talk to behavioral health, denies increase in spending, is more stressed due to covid-19 and kids at home) and insomnia (taking ambien 10 mg nightly, still working well, denies side effects, sleeping about 8 hours, denies morning grogginess).    Observations/Objective: Appearance: normal, breathing appears normal, normal grooming, abdomen does not appear distended, throat normal, mental status is A and O times 3  Assessment and Plan: See problem oriented charting  Follow Up Instructions: Referral to behavioral health, continue buspar, prostate exam in 3-4 months with visit, continue ambien for now  I discussed the assessment and treatment plan with the patient. The patient was provided an opportunity to ask questions and all were answered. The patient agreed with the plan and demonstrated an understanding of the instructions.   The patient was advised to call back or seek an in-person evaluation if the symptoms worsen or if the condition fails to improve as anticipated.  Myrlene Broker, MD

## 2018-10-15 ENCOUNTER — Other Ambulatory Visit: Payer: Self-pay | Admitting: Internal Medicine

## 2018-11-04 ENCOUNTER — Encounter: Payer: Self-pay | Admitting: Internal Medicine

## 2018-11-04 ENCOUNTER — Ambulatory Visit (INDEPENDENT_AMBULATORY_CARE_PROVIDER_SITE_OTHER): Payer: BLUE CROSS/BLUE SHIELD | Admitting: Internal Medicine

## 2018-11-04 DIAGNOSIS — L237 Allergic contact dermatitis due to plants, except food: Secondary | ICD-10-CM | POA: Insufficient documentation

## 2018-11-04 MED ORDER — PREDNISONE 20 MG PO TABS: 40.0000 mg | ORAL_TABLET | Freq: Every day | ORAL | 0 refills | Status: DC

## 2018-11-04 NOTE — Assessment & Plan Note (Signed)
Rx for prednisone pack and continue triamcinolone ointment. Advised caution with plants outdoors and discussed how to tell poison ivy/oak. Encouraged to kill any poison ivy/oak on property to reduce risk for future.

## 2018-11-04 NOTE — Progress Notes (Signed)
Virtual Visit via Video Note  I connected with [name] [name] on 11/04/18 at  8:40 AM EDT by a video enabled telemedicine application and verified that I am speaking with the correct person using two identifiers.   I discussed the limitations of evaluation and management by telemedicine and the availability of in person appointments. The patient expressed understanding and agreed to proceed.  History of Present Illness: The patient is a 43 y.o. man with visit for poison ivy. Started 8 days ago. Has exposure through his cat who is outdoor and was outside and he remembers picking her up a little bit before the itching and rash started. He has been using calamine lotion and some leftover triamcinolone ointment which has not helped much. Rash is on his chest and arms. Trying not to scratch but is scratching some and it is itching and hurting mildly. Denies fevers or chills or SOB or cough. Overall it is worsening.  Observations/Objective: Appearance: normal, breathing appears normal, casual grooming, abdomen does not appear distended, rash on the right pectoral region and upper right arm and stomach, throat normal, no facial swelling, EOM intact bilaterally, memory normal, mental status is A and O times 3  Assessment and Plan: See problem oriented charting  Follow Up Instructions: rx prednisone pack and keep using the triamcinolone ointment, use gloves and clothes to get rid of poison ivy/oak on property, use shirt when picking up cat after being outdoors  I discussed the assessment and treatment plan with the patient. The patient was provided an opportunity to ask questions and all were answered. The patient agreed with the plan and demonstrated an understanding of the instructions.   The patient was advised to call back or seek an in-person evaluation if the symptoms worsen or if the condition fails to improve as anticipated.  Myrlene Broker, MD

## 2018-12-09 ENCOUNTER — Other Ambulatory Visit: Payer: Self-pay | Admitting: Internal Medicine

## 2018-12-09 NOTE — Telephone Encounter (Signed)
Control database checked last refill: 11/19/2018 LOV: 10/04/2018 for insomnia  NOV: none

## 2018-12-24 ENCOUNTER — Ambulatory Visit: Payer: Self-pay

## 2018-12-24 ENCOUNTER — Other Ambulatory Visit: Payer: BLUE CROSS/BLUE SHIELD

## 2018-12-24 ENCOUNTER — Telehealth: Payer: Self-pay | Admitting: *Deleted

## 2018-12-24 DIAGNOSIS — R6889 Other general symptoms and signs: Secondary | ICD-10-CM | POA: Diagnosis not present

## 2018-12-24 DIAGNOSIS — Z20822 Contact with and (suspected) exposure to covid-19: Secondary | ICD-10-CM

## 2018-12-24 NOTE — Telephone Encounter (Signed)
Pt called back to be scheduled for the covid-19 test. He is scheduled for today at the [facility] at 3:30. Advised that this is a drive thru test site, so stay in car with mask on and windows rolled up until ready for testing. Pt voiced understanding.

## 2018-12-24 NOTE — Addendum Note (Signed)
Addended by: Denyce Robert on: 12/24/2018 11:40 AM   Modules accepted: Orders

## 2018-12-24 NOTE — Telephone Encounter (Signed)
Patient does not want appt.  He only wants order for testing.  Please follow up in regard.

## 2018-12-24 NOTE — Telephone Encounter (Signed)
Patient called and says he's been having a dry cough for about 3 days, no fever. He says no mucus is coming up. He says he has a slight heaviness in his chest and says it's related to the cough. He says he coughs about every 30 minutes or sooner when he has the sensation to cough. He denies wheezing, breathing difficulty. He denies any other symptoms. He says he traveled to [place] on 6/13 and stayed a whole day and one night, then back home. He says they wore masks while he was there when they went out. He asked about testing, I advised he will need a virtual visit, the the doctor will recommend testing, if needed. I called the office and spoke to [place], [facility] who asked to speak to the patient, the call was connected successfully.  Answer Assessment - Initial Assessment Questions 1. ONSET: "When did the cough begin?"      3 days ago 2. SEVERITY: "How bad is the cough today?"      About every 30 minutes, sensation in throat then a cough 3. RESPIRATORY DISTRESS: "Describe your breathing."      Breathing is fine 4. FEVER: "Do you have a fever?" If so, ask: "What is your temperature, how was it measured, and when did it start?"     No 5. HEMOPTYSIS: "Are you coughing up any blood?" If so ask: "How much?" (flecks, streaks, tablespoons, etc.)     No 6. TREATMENT: "What have you done so far to treat the cough?" (e.g., meds, fluids, humidifier)     Yesterday cough medicine once 7. CARDIAC HISTORY: "Do you have any history of heart disease?" (e.g., heart attack, congestive heart failure)       No 8. LUNG HISTORY: "Do you have any history of lung disease?"  (e.g., pulmonary embolus, asthma, emphysema)      No 9. PE RISK FACTORS: "Do you have a history of blood clots?" (or: recent major surgery, recent prolonged travel, bedridden)     Stroke, not blood clots 10. OTHER SYMPTOMS: "Do you have any other symptoms? (e.g., runny nose, wheezing, chest pain)       A little heavy chest 11. PREGNANCY: "Is there  any chance you are pregnant?" "When was your last menstrual period?"       N/A 12. TRAVEL: "Have you traveled out of the country in the last month?" (e.g., travel history, exposures)       Traveled to [place] and stayed for one night  Protocols used: COUGH - ACUTE NON-PRODUCTIVE-A-AH

## 2018-12-24 NOTE — Telephone Encounter (Signed)
Okay to get testing, please route to pool

## 2018-12-24 NOTE — Telephone Encounter (Signed)
Phone call to patient.  Left voicemail to return call regarding scheduling COVID 19 test.  Order entered.

## 2018-12-28 LAB — NOVEL CORONAVIRUS, NAA: SARS-CoV-2, NAA: NOT DETECTED

## 2019-01-04 ENCOUNTER — Other Ambulatory Visit: Payer: Self-pay | Admitting: Internal Medicine

## 2019-02-25 ENCOUNTER — Other Ambulatory Visit: Payer: Self-pay | Admitting: Internal Medicine

## 2019-02-25 NOTE — Telephone Encounter (Signed)
We sent in 4 month supply back in May so not due until October for refill.

## 2019-02-25 NOTE — Telephone Encounter (Signed)
Control database checked last refill: 02/15/2019 LOV: 10/04/2018 [id number]

## 2019-02-27 ENCOUNTER — Telehealth: Payer: Self-pay | Admitting: Internal Medicine

## 2019-02-27 NOTE — Telephone Encounter (Signed)
Patient is calling about his Ambien refill- patient states he gets a Rx for 25 per month and his insurance covers 15 and sometimes he will pay cash for the rest- per medication record- his Rx is for 15 pills. Told patient he needs to schedule an appointment if he is running out of medication. Patient states he has been taking 1/2 pill nightly- he has had to take a whole pill the last few nights due to life stresses. Then patient request annual exam.Call to office to see if patient needs to have 2 appointments because I do not think he is going to be able to get RF until a physical. This is correct- they are going to talk to him about what he will need- they have an opening tomorrow for the medication follow up.

## 2019-02-27 NOTE — Telephone Encounter (Signed)
LVM for patient informing that Dr. Sharlet Salina had sent in meds to last up till October. I had called patients pharmacy and they stated patient was filling like 10 pills at a time not fifteen. Explained to patient if there are any changes in how he is filling he may want to make a virtual appointment to discuss with Dr. Sharlet Salina that way we are all on the same page

## 2019-02-27 NOTE — Telephone Encounter (Signed)
Patient would like a refill on his zolpidem (AMBIEN) 10 MG tablet medication and have it sent to his preferred pharmacy [facility] Patient is completely out of medication.

## 2019-02-27 NOTE — Telephone Encounter (Signed)
Noted  

## 2019-02-28 ENCOUNTER — Encounter: Payer: Self-pay | Admitting: Internal Medicine

## 2019-02-28 ENCOUNTER — Ambulatory Visit (INDEPENDENT_AMBULATORY_CARE_PROVIDER_SITE_OTHER): Payer: BC Managed Care – PPO | Admitting: Internal Medicine

## 2019-02-28 DIAGNOSIS — F5101 Primary insomnia: Secondary | ICD-10-CM

## 2019-02-28 MED ORDER — ZOLPIDEM TARTRATE 10 MG PO TABS: ORAL_TABLET | ORAL | 3 refills | Status: DC

## 2019-02-28 NOTE — Progress Notes (Signed)
Virtual Visit via Video Note  I connected with [name] [name] on 02/28/19 at  3:40 PM EDT by a video enabled telemedicine application and verified that I am speaking with the correct person using two identifiers.  The patient and the provider were at separate locations throughout the entire encounter.   I discussed the limitations of evaluation and management by telemedicine and the availability of in person appointments. The patient expressed understanding and agreed to proceed.  History of Present Illness: The patient is a 43 y.o. man with visit for insomnia. Previously he has been taking 5 mg ambien most nights. Gets 10 mg and breaks in half due to insurance reasons they will only pay for some many of any strength ambien. For some reason the pharmacy has been only dispensing 10 tablets the last several times so he ran through his 4 month supply in about 3 months. He is out of [place]. He would like refill and wants to try melatonin also as he does not want to be on this forever. Denies side effects. Is more stressed lately and has rarely taken an entire 10 mg ambien some nights.  Observations/Objective: Appearance: normal, breathing appears normal, casual grooming, abdomen does not appear distended, throat normal, memory normal, mental status is A and O times 3  Assessment and Plan: See problem oriented charting  Follow Up Instructions: refill ambien #30 per month, reminded patient he should make sure pharmacy is dispensing proper amount so that he is not using up his refills too quickly as this can cause delays in [place] getting things refilled  I discussed the assessment and treatment plan with the patient. The patient was provided an opportunity to ask questions and all were answered. The patient agreed with the plan and demonstrated an understanding of the instructions.   The patient was advised to call back or seek an in-person evaluation if the symptoms worsen or if the condition fails to  improve as anticipated.  [name], MD

## 2019-02-28 NOTE — Assessment & Plan Note (Signed)
Refilled ambien and okay with taking 10 mg some nights as needed. If any trouble with pharmacy dispensing he will call office to let us know and we can work through this with them.

## 2019-04-15 ENCOUNTER — Other Ambulatory Visit: Payer: Self-pay | Admitting: Internal Medicine

## 2019-04-18 ENCOUNTER — Ambulatory Visit (INDEPENDENT_AMBULATORY_CARE_PROVIDER_SITE_OTHER): Payer: BC Managed Care – PPO | Admitting: Internal Medicine

## 2019-04-18 ENCOUNTER — Other Ambulatory Visit (INDEPENDENT_AMBULATORY_CARE_PROVIDER_SITE_OTHER): Payer: BC Managed Care – PPO

## 2019-04-18 ENCOUNTER — Encounter: Payer: Self-pay | Admitting: Internal Medicine

## 2019-04-18 ENCOUNTER — Other Ambulatory Visit: Payer: Self-pay

## 2019-04-18 VITALS — BP 122/86 | HR 85 | Temp 98.6°F | Ht 68.0 in | Wt 215.0 lb

## 2019-04-18 DIAGNOSIS — Z0000 Encounter for general adult medical examination without abnormal findings: Secondary | ICD-10-CM | POA: Diagnosis not present

## 2019-04-18 DIAGNOSIS — F5101 Primary insomnia: Secondary | ICD-10-CM | POA: Diagnosis not present

## 2019-04-18 DIAGNOSIS — N528 Other male erectile dysfunction: Secondary | ICD-10-CM

## 2019-04-18 DIAGNOSIS — Z23 Encounter for immunization: Secondary | ICD-10-CM

## 2019-04-18 DIAGNOSIS — E781 Pure hyperglyceridemia: Secondary | ICD-10-CM

## 2019-04-18 LAB — COMPREHENSIVE METABOLIC PANEL
ALT: 27 U/L (ref 0–53)
AST: 23 U/L (ref 0–37)
Albumin: 4.7 g/dL (ref 3.5–5.2)
Alkaline Phosphatase: 58 U/L (ref 39–117)
BUN: 12 mg/dL (ref 6–23)
CO2: 26 mEq/L (ref 19–32)
Calcium: 9.6 mg/dL (ref 8.4–10.5)
Chloride: 104 mEq/L (ref 96–112)
Creatinine, Ser: 1.15 mg/dL (ref 0.40–1.50)
GFR: 69.22 mL/min (ref >60.00)
Glucose, Bld: 107 mg/dL — ABNORMAL HIGH (ref 70–99)
Potassium: 3.8 mEq/L (ref 3.5–5.1)
Sodium: 139 mEq/L (ref 135–145)
Total Bilirubin: 0.5 mg/dL (ref 0.2–1.2)
Total Protein: 7.3 g/dL (ref 6.0–8.3)

## 2019-04-18 LAB — CBC
HCT: 42.5 % (ref 39.0–52.0)
Hemoglobin: 14.2 g/dL (ref 13.0–17.0)
MCHC: 33.4 g/dL (ref 30.0–36.0)
MCV: 89.7 fl (ref 78.0–100.0)
Platelets: 218 10*3/uL (ref 150.0–400.0)
RBC: 4.74 Mil/uL (ref 4.22–5.81)
RDW: 13.4 % (ref 11.5–15.5)
WBC: 8.9 10*3/uL (ref 4.0–10.5)

## 2019-04-18 LAB — LIPID PANEL
Cholesterol: 203 mg/dL — ABNORMAL HIGH (ref 0–200)
HDL: 39.5 mg/dL (ref >39.00)
NonHDL: 163.72
Total CHOL/HDL Ratio: 5
Triglycerides: 286 mg/dL — ABNORMAL HIGH (ref 0.0–149.0)
VLDL: 57.2 mg/dL — ABNORMAL HIGH (ref 0.0–40.0)

## 2019-04-18 LAB — LDL CHOLESTEROL, DIRECT: Direct LDL: 135 mg/dL

## 2019-04-18 LAB — HEMOGLOBIN A1C: Hgb A1c MFr Bld: 6.1 % (ref 4.6–6.5)

## 2019-04-18 MED ORDER — BETAMETHASONE VALERATE 0.1 % EX OINT: 1.0000 "application " | TOPICAL_OINTMENT | Freq: Two times a day (BID) | CUTANEOUS | 0 refills | Status: DC

## 2019-04-18 MED ORDER — IBUPROFEN 800 MG PO TABS: 800.0000 mg | ORAL_TABLET | Freq: Three times a day (TID) | ORAL | 0 refills | Status: DC | PRN

## 2019-04-18 NOTE — Progress Notes (Signed)
   Subjective:   Patient ID: [name] [name], male    DOB: 02-06-1976, 43 y.o.   MRN: [id number]  HPI The patient is a 43 YO man coming in for physical.   PMH, [facility], social history reviewed and updated.   Review of Systems  Constitutional: Negative.   HENT: Negative.   Eyes: Negative.   Respiratory: Negative for cough, chest tightness and shortness of breath.   Cardiovascular: Negative for chest pain, palpitations and leg swelling.  Gastrointestinal: Negative for abdominal distention, abdominal pain, constipation, diarrhea, nausea and vomiting.  Musculoskeletal: Negative.   Skin: Negative.   Neurological: Negative.   Psychiatric/Behavioral: Positive for sleep disturbance.    Objective:  Physical Exam Constitutional:      Appearance: He is well-developed.  HENT:     Head: Normocephalic and atraumatic.  Neck:     Musculoskeletal: Normal range of motion.  Cardiovascular:     Rate and Rhythm: Normal rate and regular rhythm.  Pulmonary:     Effort: Pulmonary effort is normal. No respiratory distress.     Breath sounds: Normal breath sounds. No wheezing or rales.  Abdominal:     General: Bowel sounds are normal. There is no distension.     Palpations: Abdomen is soft.     Tenderness: There is no abdominal tenderness. There is no rebound.  Skin:    General: Skin is warm and dry.  Neurological:     Mental Status: He is alert and oriented to person, place, and time.     Coordination: Coordination normal.     Vitals:   04/18/19 1436  BP: 122/86  Pulse: 85  Temp: 98.6 F (37 C)  TempSrc: Oral  SpO2: 98%  Weight: 215 lb (97.5 kg)  Height: 5\' 8"  (1.727 m)    Assessment & Plan:

## 2019-04-18 NOTE — Assessment & Plan Note (Signed)
Flu shot given. Tetanus declines. Counseled about sun safety and mole surveillance. Counseled about the dangers of distracted driving. Given 10 year screening recommendations.

## 2019-04-18 NOTE — Assessment & Plan Note (Signed)
Taking ambien 10 mg daily and continue.

## 2019-04-18 NOTE — Assessment & Plan Note (Signed)
Checking lipid panel and adjust as needed.  

## 2019-04-18 NOTE — Patient Instructions (Addendum)
Consider looking on goodrx to see if cialis 10 mg is reasonable. Additionally there are sex therapists out there to help work out some of the other factors that can play into that.   Health Maintenance, Male Adopting a healthy lifestyle and getting preventive care are important in promoting health and wellness. Ask your health care provider about:  The right schedule for you to have regular tests and exams.  Things you can do on your own to prevent diseases and keep yourself healthy. What should I know about diet, weight, and exercise? Eat a healthy diet   Eat a diet that includes plenty of vegetables, fruits, low-fat dairy products, and lean protein.  Do not eat a lot of foods that are high in solid fats, added sugars, or sodium. Maintain a healthy weight Body mass index (BMI) is a measurement that can be used to identify possible weight problems. It estimates body fat based on height and weight. Your health care provider can help determine your BMI and help you achieve or maintain a healthy weight. Get regular exercise Get regular exercise. This is one of the most important things you can do for your health. Most adults should:  Exercise for at least 150 minutes each week. The exercise should increase your heart rate and make you sweat (moderate-intensity exercise).  Do strengthening exercises at least twice a week. This is in addition to the moderate-intensity exercise.  Spend less time sitting. Even light physical activity can be beneficial. Watch cholesterol and blood lipids Have your blood tested for lipids and cholesterol at 43 years of age, then have this test every 5 years. You may need to have your cholesterol levels checked more often if:  Your lipid or cholesterol levels are high.  You are older than 43 years of age.  You are at high risk for heart disease. What should I know about cancer screening? Many types of cancers can be detected early and may often be prevented.  Depending on your health history and family history, you may need to have cancer screening at various ages. This may include screening for:  Colorectal cancer.  Prostate cancer.  Skin cancer.  Lung cancer. What should I know about heart disease, diabetes, and high blood pressure? Blood pressure and heart disease  High blood pressure causes heart disease and increases the risk of stroke. This is more likely to develop in people who have high blood pressure readings, are of African descent, or are overweight.  Talk with your health care provider about your target blood pressure readings.  Have your blood pressure checked: ? Every 3-5 years if you are 10-40 years of age. ? Every year if you are 68 years old or older.  If you are between the ages of 15 and 63 and are a current or former smoker, ask your health care provider if you should have a one-time screening for abdominal aortic aneurysm (AAA). Diabetes Have regular diabetes screenings. This checks your fasting blood sugar level. Have the screening done:  Once every three years after age 4 if you are at a normal weight and have a low risk for diabetes.  More often and at a younger age if you are overweight or have a high risk for diabetes. What should I know about preventing infection? Hepatitis B If you have a higher risk for hepatitis B, you should be screened for this virus. Talk with your health care provider to find out if you are at risk for hepatitis B  infection. Hepatitis C Blood testing is recommended for:  Everyone born from [birth year] through 1965.  Anyone with known risk factors for hepatitis C. Sexually transmitted infections (STIs)  You should be screened each year for STIs, including gonorrhea and chlamydia, if: ? You are sexually active and are younger than 43 years of age. ? You are older than 43 years of age and your health care provider tells you that you are at risk for this type of infection. ? Your sexual  activity has changed since you were last screened, and you are at increased risk for chlamydia or gonorrhea. Ask your health care provider if you are at risk.  Ask your health care provider about whether you are at high risk for HIV. Your health care provider may recommend a prescription medicine to help prevent HIV infection. If you choose to take medicine to prevent HIV, you should first get tested for HIV. You should then be tested every 3 months for as long as you are taking the medicine. Follow these instructions at home: Lifestyle  Do not use any products that contain nicotine or tobacco, such as cigarettes, e-cigarettes, and chewing tobacco. If you need help quitting, ask your health care provider.  Do not use street drugs.  Do not share needles.  Ask your health care provider for help if you need support or information about quitting drugs. Alcohol use  Do not drink alcohol if your health care provider tells you not to drink.  If you drink alcohol: ? Limit how much you have to 0-2 drinks a day. ? Be aware of how much alcohol is in your drink. In the [place] one drink equals one 12 oz bottle of beer (355 mL), one 5 oz glass of wine (148 mL), or one 1 oz glass of hard liquor (44 mL). General instructions  Schedule regular health, dental, and eye exams.  Stay current with your vaccines.  Tell your health care provider if: ? You often feel depressed. ? You have ever been abused or do not feel safe at home. Summary  Adopting a healthy lifestyle and getting preventive care are important in promoting health and wellness.  Follow your health care provider's instructions about healthy diet, exercising, and getting tested or screened for diseases.  Follow your health care provider's instructions on monitoring your cholesterol and blood pressure. This information is not intended to replace advice given to you by your health care provider. Make sure you discuss any questions you have  with your health care provider. Document Released: 12/16/2007 Document Revised: 06/12/2018 Document Reviewed: 06/12/2018 Elsevier Patient Education  2020 [employer].

## 2019-04-18 NOTE — Assessment & Plan Note (Signed)
Advised we could try cialis 10 mg as needed if he wants.

## 2019-06-23 ENCOUNTER — Other Ambulatory Visit: Payer: Self-pay | Admitting: Internal Medicine

## 2019-06-24 NOTE — Telephone Encounter (Signed)
Filled 05/27/2019 Zolpidem Tartrate 10 Mg Tablet 15#  Last OV - 04/18/19  Next OV - not scheduled

## 2019-07-19 IMAGING — DX DG SHOULDER 2+V*R*
3 series · 3 of 3 positions shown · non-contrast
Comparison: None.

CLINICAL DATA: 42-year-old male status post fall twelve days ago
with pain and limited range of motion.

EXAM:
RIGHT SHOULDER - 2+ VIEW

[grashey]
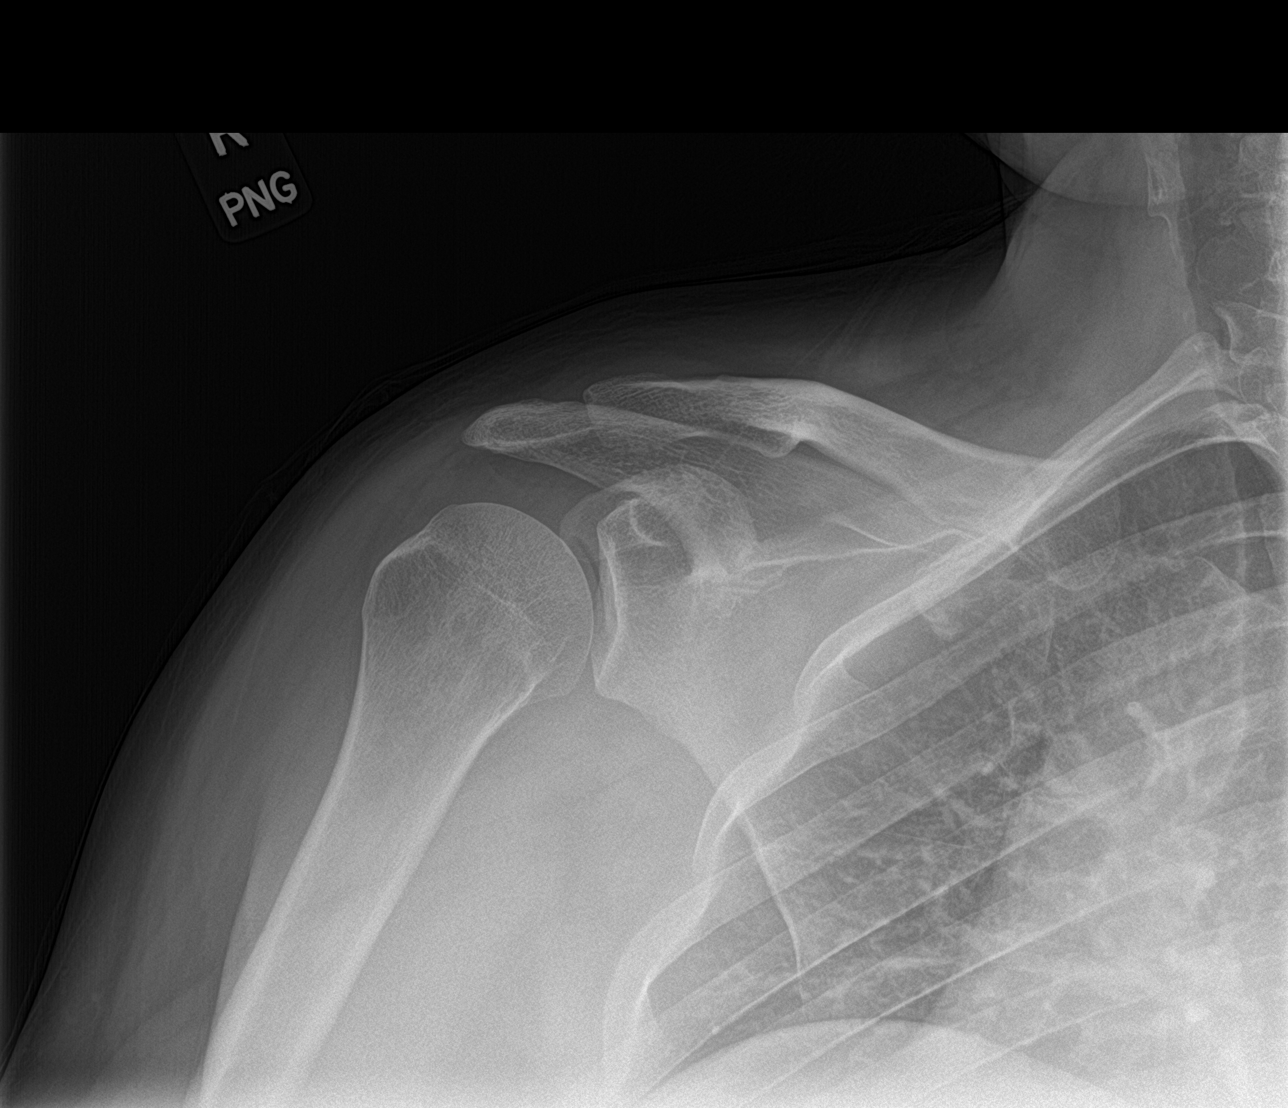

[y view]
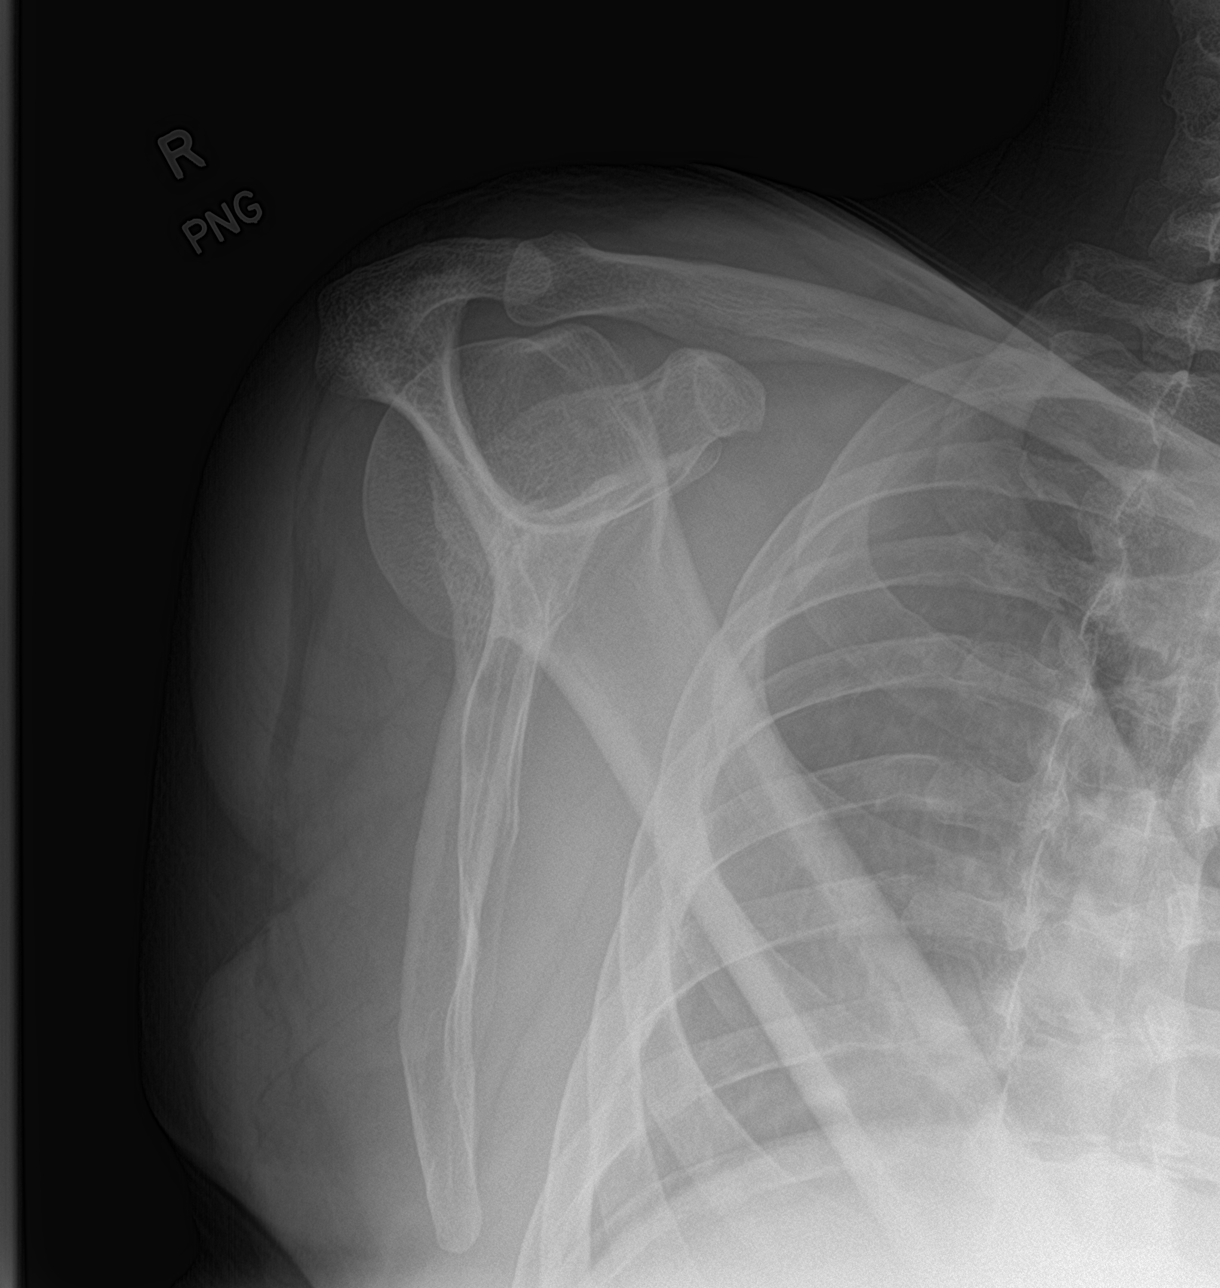

[shoulder axial]
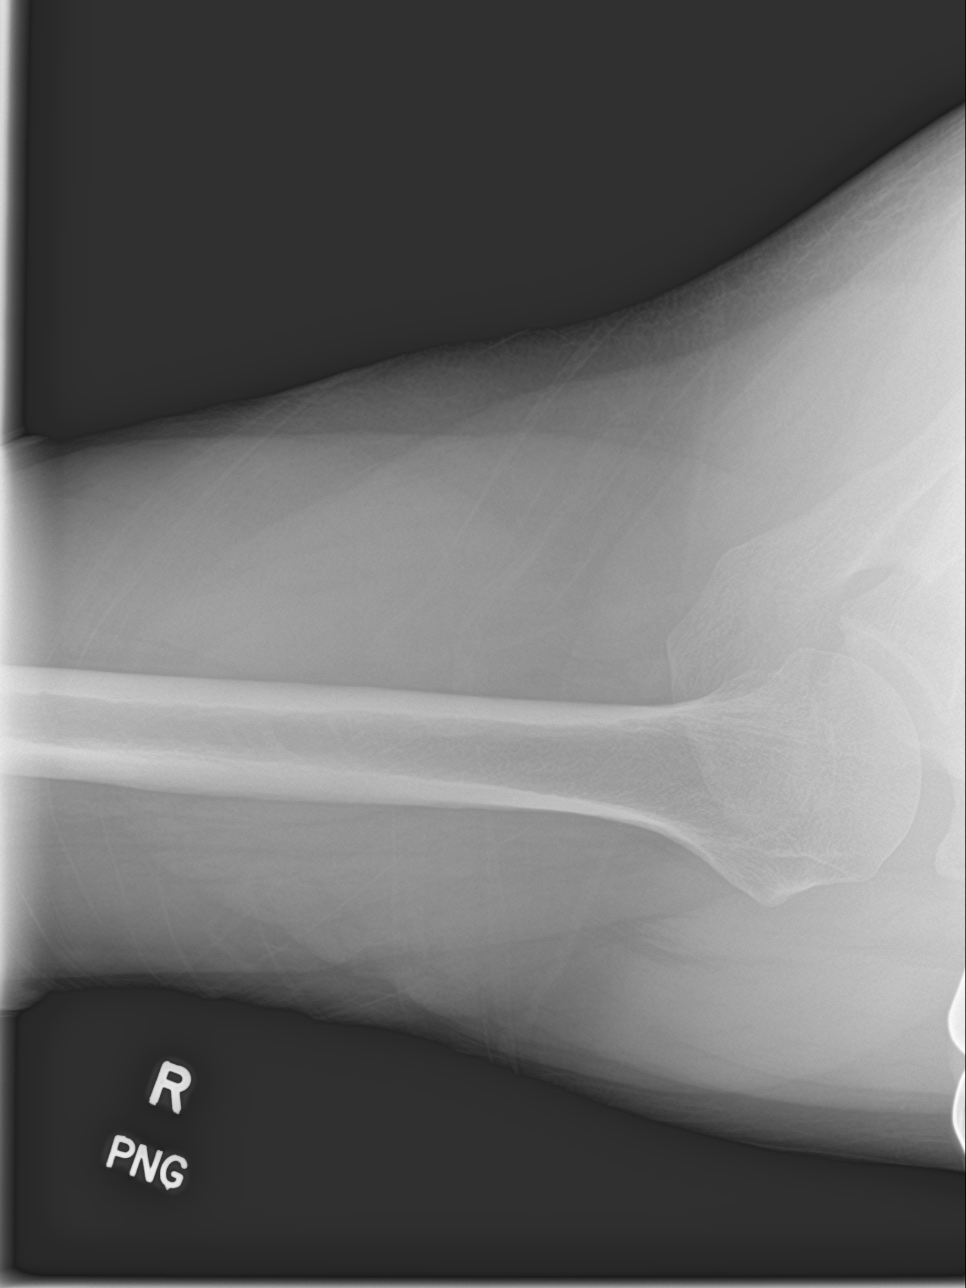

[3 of 3 positions shown; findings below may reference images not displayed]

FINDINGS: Bone mineralization is within normal limits. There is no evidence of
fracture or dislocation. There is no evidence of arthropathy or
other focal bone abnormality. Negative visible right ribs and lung
parenchyma.
IMPRESSION: Negative.

## 2019-07-23 ENCOUNTER — Other Ambulatory Visit: Payer: Self-pay | Admitting: Internal Medicine

## 2019-09-25 ENCOUNTER — Ambulatory Visit (INDEPENDENT_AMBULATORY_CARE_PROVIDER_SITE_OTHER): Payer: BC Managed Care – PPO | Admitting: Internal Medicine

## 2019-09-25 ENCOUNTER — Other Ambulatory Visit: Payer: Self-pay

## 2019-09-25 ENCOUNTER — Other Ambulatory Visit: Payer: Self-pay | Admitting: Internal Medicine

## 2019-09-25 ENCOUNTER — Encounter: Payer: Self-pay | Admitting: Internal Medicine

## 2019-09-25 VITALS — BP 118/68 | HR 79 | Temp 97.9°F | Resp 16 | Ht 68.0 in | Wt 215.0 lb

## 2019-09-25 DIAGNOSIS — M545 Low back pain, unspecified: Secondary | ICD-10-CM | POA: Insufficient documentation

## 2019-09-25 MED ORDER — IBUPROFEN 800 MG PO TABS: 800.0000 mg | ORAL_TABLET | Freq: Three times a day (TID) | ORAL | 1 refills | Status: DC | PRN

## 2019-09-25 NOTE — Patient Instructions (Signed)
Acute Back Pain, Adult Acute back pain is sudden and usually short-lived. It is often caused by an injury to the muscles and tissues in the back. The injury may result from:  A muscle or ligament getting overstretched or torn (strained). Ligaments are tissues that connect bones to each other. Lifting something improperly can cause a back strain.  Wear and tear (degeneration) of the spinal disks. Spinal disks are circular tissue that provides cushioning between the bones of the spine (vertebrae).  Twisting motions, such as while playing sports or doing yard work.  A hit to the back.  Arthritis. You may have a physical exam, lab tests, and imaging tests to find the cause of your pain. Acute back pain usually goes away with rest and home care. Follow these instructions at home: Managing pain, stiffness, and swelling  Take over-the-counter and prescription medicines only as told by your health care provider.  Your health care provider may recommend applying ice during the first 24-48 hours after your pain starts. To do this: ? Put ice in a plastic bag. ? Place a towel between your skin and the bag. ? Leave the ice on for 20 minutes, 2-3 times a day.  If directed, apply heat to the affected area as often as told by your health care provider. Use the heat source that your health care provider recommends, such as a moist heat pack or a heating pad. ? Place a towel between your skin and the heat source. ? Leave the heat on for 20-30 minutes. ? Remove the heat if your skin turns bright red. This is especially important if you are unable to feel pain, heat, or cold. You have a greater risk of getting burned. Activity   Do not stay in bed. Staying in bed for more than 1-2 days can delay your recovery.  Sit up and stand up straight. Avoid leaning forward when you sit, or hunching over when you stand. ? If you work at a desk, sit close to it so you do not need to lean over. Keep your chin tucked  in. Keep your neck drawn back, and keep your elbows bent at a right angle. Your arms should look like the letter "L." ? Sit high and close to the steering wheel when you drive. Add lower back (lumbar) support to your car seat, if needed.  Take short walks on even surfaces as soon as you are able. Try to increase the length of time you walk each day.  Do not sit, drive, or stand in one place for more than 30 minutes at a time. Sitting or standing for long periods of time can put stress on your back.  Do not drive or use heavy machinery while taking prescription pain medicine.  Use proper lifting techniques. When you bend and lift, use positions that put less stress on your back: ? Bend your knees. ? Keep the load close to your body. ? Avoid twisting.  Exercise regularly as told by your health care provider. Exercising helps your back heal faster and helps prevent back injuries by keeping muscles strong and flexible.  Work with a physical therapist to make a safe exercise program, as recommended by your health care provider. Do any exercises as told by your physical therapist. Lifestyle  Maintain a healthy weight. Extra weight puts stress on your back and makes it difficult to have good posture.  Avoid activities or situations that make you feel anxious or stressed. Stress and anxiety increase muscle   tension and can make back pain worse. Learn ways to manage anxiety and stress, such as through exercise. General instructions  Sleep on a firm mattress in a comfortable position. Try lying on your side with your knees slightly bent. If you lie on your back, put a pillow under your knees.  Follow your treatment plan as told by your health care provider. This may include: ? Cognitive or behavioral therapy. ? Acupuncture or massage therapy. ? Meditation or yoga. Contact a health care provider if:  You have pain that is not relieved with rest or medicine.  You have increasing pain going down  into your legs or buttocks.  Your pain does not improve after 2 weeks.  You have pain at night.  You lose weight without trying.  You have a fever or chills. Get help right away if:  You develop new bowel or bladder control problems.  You have unusual weakness or numbness in your arms or legs.  You develop nausea or vomiting.  You develop abdominal pain.  You feel faint. Summary  Acute back pain is sudden and usually short-lived.  Use proper lifting techniques. When you bend and lift, use positions that put less stress on your back.  Take over-the-counter and prescription medicines and apply heat or ice as directed by your health care provider. This information is not intended to replace advice given to you by your health care provider. Make sure you discuss any questions you have with your health care provider. Document Revised: 10/08/2018 Document Reviewed: 01/31/2017 Elsevier Patient Education  2020 [employer]  

## 2019-09-25 NOTE — Progress Notes (Signed)
Subjective:  Patient ID: [name] [name], male    DOB: 1976/01/31  Age: 44 y.o. MRN: [id number]  CC: Back Pain  This visit occurred during the SARS-CoV-2 public health emergency.  Safety protocols were in place, including screening questions prior to the visit, additional usage of staff PPE, and extensive cleaning of exam room while observing appropriate contact time as indicated for disinfecting solutions.   HPI [name] [name] presents for concerns about LBP.  He did some heavy yard work about 3 weeks ago since then he has had low back pain that he describes as a tightness and a soreness.  There was no trauma or injury.  He said the pain does not radiate into his lower extremities and he denies paresthesias.  He has been taking ibuprofen for the pain with some relief but he recently ran out.  Outpatient Medications Prior to Visit  Medication Sig Dispense Refill  . betamethasone valerate ointment (VALISONE) 0.1 % Apply 1 application topically 2 (two) times daily. 100 g 0  . busPIRone (BUSPAR) 5 MG tablet TAKE 1 TABLET (5 MG TOTAL) BY MOUTH 2 (TWO) TIMES DAILY AS NEEDED. 180 tablet 1  . fenofibrate 160 MG tablet TAKE 1 TABLET BY MOUTH EVERY DAY 90 tablet 3  . omeprazole (PRILOSEC) 40 MG capsule TAKE 1 CAPSULE (40 MG TOTAL) DAILY BY MOUTH. 90 capsule 2  . sildenafil (REVATIO) 20 MG tablet TAKE ONE TABLET BY MOUTH 3 TIMES DAILY 90 tablet 3  . zolpidem (AMBIEN) 10 MG tablet TAKE 1 TABLET (10 MG TOTAL) BY MOUTH AT BEDTIME AS NEEDED FOR SLEEP.INS ALLOWS 15TAB/25 DAYS 30 tablet 3  . ibuprofen (ADVIL) 800 MG tablet Take 1 tablet (800 mg total) by mouth every 8 (eight) hours as needed. 30 tablet 0   No facility-administered medications prior to visit.    ROS Review of Systems  Constitutional: Negative for appetite change, diaphoresis, fatigue and unexpected weight change.  HENT: Negative.   Eyes: Negative.   Respiratory: Negative for cough, chest tightness, shortness of breath and wheezing.    Cardiovascular: Negative for chest pain, palpitations and leg swelling.  Gastrointestinal: Negative for abdominal pain, constipation, diarrhea, nausea and vomiting.  Endocrine: Negative.   Genitourinary: Negative.  Negative for difficulty urinating, dysuria and hematuria.  Musculoskeletal: Positive for back pain. Negative for arthralgias, myalgias and neck pain.  Skin: Negative.   Neurological: Negative.  Negative for dizziness, weakness and numbness.  Hematological: Negative for adenopathy. Does not bruise/bleed easily.  Psychiatric/Behavioral: Negative.     Objective:  BP 118/68 (BP Location: Left Arm, Patient Position: Sitting, Cuff Size: Large)   Pulse 79   Temp 97.9 F (36.6 C) (Oral)   Resp 16   Ht 5\' 8"  (1.727 m)   Wt 215 lb (97.5 kg)   SpO2 97%   BMI 32.69 kg/m   BP Readings from Last 3 Encounters:  09/25/19 118/68  04/18/19 122/86  12/27/17 132/84    Wt Readings from Last 3 Encounters:  09/25/19 215 lb (97.5 kg)  04/18/19 215 lb (97.5 kg)  12/27/17 213 lb (96.6 kg)    Physical Exam Vitals reviewed.  Constitutional:      Appearance: Normal appearance.  HENT:     Nose: Nose normal.     Mouth/Throat:     Mouth: Mucous membranes are moist.  Eyes:     General: No scleral icterus.    Conjunctiva/sclera: Conjunctivae normal.  Cardiovascular:     Rate and Rhythm: Normal rate and regular rhythm.  Heart sounds: No murmur.  Pulmonary:     Breath sounds: No stridor. No wheezing, rhonchi or rales.  Abdominal:     General: Abdomen is protuberant. Bowel sounds are normal. There is no distension.     Palpations: Abdomen is soft. There is no hepatomegaly, splenomegaly or mass.  Musculoskeletal:        General: Normal range of motion.     Cervical back: Normal and neck supple.     Thoracic back: Normal.     Lumbar back: No swelling, edema, deformity, tenderness or bony tenderness. Normal range of motion. Negative right straight leg raise test and negative left  straight leg raise test.     Right lower leg: No edema.     Left lower leg: No edema.  Lymphadenopathy:     Cervical: No cervical adenopathy.  Skin:    General: Skin is warm and dry.  Neurological:     General: No focal deficit present.     Mental Status: He is alert and oriented to person, place, and time. Mental status is at baseline.     Motor: Motor function is intact. No weakness or atrophy.     Coordination: Coordination is intact. Coordination normal.     Gait: Gait is intact. Gait and tandem walk normal.     Deep Tendon Reflexes: Reflexes normal.     Reflex Scores:      Tricep reflexes are 1+ on the right side and 1+ on the left side.      Bicep reflexes are 1+ on the right side and 1+ on the left side.      Brachioradialis reflexes are 1+ on the right side and 1+ on the left side.      Patellar reflexes are 1+ on the right side and 1+ on the left side.      Achilles reflexes are 1+ on the right side and 1+ on the left side.    Lab Results  Component Value Date   WBC 8.9 04/18/2019   HGB 14.2 04/18/2019   HCT 42.5 04/18/2019   PLT 218.0 04/18/2019   GLUCOSE 107 (H) 04/18/2019   CHOL 203 (H) 04/18/2019   TRIG 286.0 (H) 04/18/2019   HDL 39.50 04/18/2019   LDLDIRECT 135.0 04/18/2019   ALT 27 04/18/2019   AST 23 04/18/2019   NA 139 04/18/2019   K 3.8 04/18/2019   CL 104 04/18/2019   CREATININE 1.15 04/18/2019   BUN 12 04/18/2019   CO2 26 04/18/2019   HGBA1C 6.1 04/18/2019    DG Shoulder Right  Result Date: 11/28/2017 CLINICAL DATA:  44 year old male status post fall twelve days ago with pain and limited range of motion. EXAM: RIGHT SHOULDER - 2+ VIEW COMPARISON:  None. FINDINGS: Bone mineralization is within normal limits. There is no evidence of fracture or dislocation. There is no evidence of arthropathy or other focal bone abnormality. Negative visible right ribs and lung parenchyma. IMPRESSION: Negative. Electronically Signed   By: Odessa Fleming M.D.   On: 11/28/2017  08:37    Assessment & Plan:   [name] [name] was seen today for back pain.  Diagnoses and all orders for this visit:  Acute bilateral low back pain without sciatica- He has mechanical low back pain with no radicular symptoms.  There are no alarming features.  I do not think imaging is indicated at this time.  I recommended that he try to control the pain with high-dose ibuprofen.  If the pain does not resolve in  the next few weeks and he will let me know and we will consider doing an imaging study. -     ibuprofen (ADVIL) 800 MG tablet; Take 1 tablet (800 mg total) by mouth every 8 (eight) hours as needed.   I am having Mare Loan maintain his busPIRone, betamethasone valerate ointment, omeprazole, zolpidem, sildenafil, fenofibrate, and ibuprofen.  Meds ordered this encounter  Medications  . ibuprofen (ADVIL) 800 MG tablet    Sig: Take 1 tablet (800 mg total) by mouth every 8 (eight) hours as needed.    Dispense:  90 tablet    Refill:  1     Follow-up: Return in about 4 weeks (around 10/23/2019).  Scarlette Calico, MD

## 2019-10-21 ENCOUNTER — Other Ambulatory Visit: Payer: Self-pay | Admitting: Internal Medicine

## 2019-11-16 ENCOUNTER — Other Ambulatory Visit: Payer: Self-pay | Admitting: Internal Medicine

## 2019-11-16 DIAGNOSIS — M545 Low back pain, unspecified: Secondary | ICD-10-CM

## 2019-12-22 DIAGNOSIS — N451 Epididymitis: Secondary | ICD-10-CM | POA: Diagnosis not present

## 2019-12-26 ENCOUNTER — Other Ambulatory Visit: Payer: Self-pay | Admitting: Internal Medicine

## 2019-12-26 NOTE — Telephone Encounter (Signed)
12/07/2019 Zolpidem Tartrate 10 Mg Tablet 15#  Last ov 09/25/19(Jones) 04/18/19(Crawford) Next ov n/s

## 2020-01-02 DIAGNOSIS — N451 Epididymitis: Secondary | ICD-10-CM | POA: Diagnosis not present

## 2020-01-08 DIAGNOSIS — N50811 Right testicular pain: Secondary | ICD-10-CM | POA: Diagnosis not present

## 2020-01-08 DIAGNOSIS — R3912 Poor urinary stream: Secondary | ICD-10-CM | POA: Diagnosis not present

## 2020-01-08 DIAGNOSIS — N401 Enlarged prostate with lower urinary tract symptoms: Secondary | ICD-10-CM | POA: Diagnosis not present

## 2020-01-08 DIAGNOSIS — R3916 Straining to void: Secondary | ICD-10-CM | POA: Diagnosis not present

## 2020-01-14 ENCOUNTER — Other Ambulatory Visit: Payer: Self-pay | Admitting: Internal Medicine

## 2020-01-14 DIAGNOSIS — M545 Low back pain, unspecified: Secondary | ICD-10-CM

## 2020-02-24 ENCOUNTER — Other Ambulatory Visit: Payer: Self-pay | Admitting: Internal Medicine

## 2020-02-26 DIAGNOSIS — N50812 Left testicular pain: Secondary | ICD-10-CM | POA: Diagnosis not present

## 2020-05-13 DIAGNOSIS — Z0000 Encounter for general adult medical examination without abnormal findings: Secondary | ICD-10-CM | POA: Diagnosis not present

## 2020-05-13 DIAGNOSIS — Z13220 Encounter for screening for lipoid disorders: Secondary | ICD-10-CM | POA: Diagnosis not present

## 2020-05-13 DIAGNOSIS — Z23 Encounter for immunization: Secondary | ICD-10-CM | POA: Diagnosis not present

## 2020-07-20 ENCOUNTER — Other Ambulatory Visit: Payer: Self-pay | Admitting: Internal Medicine

## 2020-07-20 NOTE — Telephone Encounter (Signed)
LR: 12-29-2019 Qty: 30 with 4 refills  Last office visit: 09-25-2019 Upcoming appointment: No pending appointment

## 2020-08-16 ENCOUNTER — Other Ambulatory Visit: Payer: Self-pay | Admitting: Internal Medicine

## 2020-09-24 ENCOUNTER — Other Ambulatory Visit: Payer: Self-pay

## 2020-09-24 ENCOUNTER — Ambulatory Visit (INDEPENDENT_AMBULATORY_CARE_PROVIDER_SITE_OTHER): Payer: BC Managed Care – PPO | Admitting: Internal Medicine

## 2020-09-24 ENCOUNTER — Encounter: Payer: Self-pay | Admitting: Internal Medicine

## 2020-09-24 DIAGNOSIS — F39 Unspecified mood [affective] disorder: Secondary | ICD-10-CM

## 2020-09-24 DIAGNOSIS — R4189 Other symptoms and signs involving cognitive functions and awareness: Secondary | ICD-10-CM

## 2020-09-24 NOTE — Patient Instructions (Signed)
Work on doing some brain exercises.

## 2020-09-24 NOTE — Progress Notes (Signed)
   Subjective:   Patient ID: [name] [name], male    DOB: 09/07/1975, 45 y.o.   MRN: [id number]  HPI The patient is a 45 YO man coming in for concerns about recent head injury back in February. Did have vasovagal syncope with injury to his right forehead. Was sutured and had head CT without findings. Since then the wound has healed but still having some slowing of his cognition. Not getting in the way of work. Still feels sharp there. But family has noticed he is repeating himself sometimes. He does feel not as sharp at times. Denies vision changes. Denies numbness or weakness. Still using cannabis daily about the same amount.   Review of Systems  Constitutional: Negative.   HENT: Negative.   Eyes: Negative.   Respiratory: Negative for cough, chest tightness and shortness of breath.   Cardiovascular: Negative for chest pain, palpitations and leg swelling.  Gastrointestinal: Negative for abdominal distention, abdominal pain, constipation, diarrhea, nausea and vomiting.  Musculoskeletal: Negative.   Skin: Negative.   Neurological: Negative.   Psychiatric/Behavioral: Positive for decreased concentration.    Objective:  Physical Exam Constitutional:      Appearance: He is well-developed.  HENT:     Head: Normocephalic and atraumatic.  Cardiovascular:     Rate and Rhythm: Normal rate and regular rhythm.  Pulmonary:     Effort: Pulmonary effort is normal. No respiratory distress.     Breath sounds: Normal breath sounds. No wheezing or rales.  Abdominal:     General: Bowel sounds are normal. There is no distension.     Palpations: Abdomen is soft.     Tenderness: There is no abdominal tenderness. There is no rebound.  Musculoskeletal:     Cervical back: Normal range of motion.  Skin:    General: Skin is warm and dry.  Neurological:     Mental Status: He is alert and oriented to person, place, and time.     Coordination: Coordination normal.  Psychiatric:     Comments: Thoughts  linear and normal thinking during exam     Vitals:   09/24/20 1105  BP: 118/70  Pulse: 71  Resp: 18  Temp: 98.7 F (37.1 C)  TempSrc: Oral  SpO2: 98%  Weight: 211 lb 9.6 oz (96 kg)  Height: 5\' 8"  (1.727 m)    This visit occurred during the SARS-CoV-2 public health emergency.  Safety protocols were in place, including screening questions prior to the visit, additional usage of staff PPE, and extensive cleaning of exam room while observing appropriate contact time as indicated for disinfecting solutions.   Assessment & Plan:

## 2020-09-24 NOTE — Assessment & Plan Note (Signed)
We talked about how he could have mild concussion symptoms. Advised to do some cognitive training to help overall. Monitor CT head reviewed with him during visit from Feb 2022.

## 2020-09-24 NOTE — Assessment & Plan Note (Signed)
Overall stable and denies flare of this currently.

## 2020-10-28 ENCOUNTER — Other Ambulatory Visit: Payer: Self-pay | Admitting: Internal Medicine

## 2020-10-29 NOTE — Telephone Encounter (Signed)
Patient is requesting a refill of the following medications: Requested Prescriptions   Pending Prescriptions Disp Refills  . zolpidem (AMBIEN) 10 MG tablet [Pharmacy Med Name: ZOLPIDEM TARTRATE 10 MG TABLET] 15 tablet 3    Sig: TAKE 1 TABLET BY MOUTH AT BEDTIME AS NEEDED FOR SLEEP.INS ALLOWS 15TAB/25 DAYS    Date of patient request: 10/28/20  Last office visit: 09/24/20  Date of last refill: 07/20/20  Last refill amount: 15 tab,3 refill Follow up time period per chart: n/a

## 2020-11-19 ENCOUNTER — Other Ambulatory Visit: Payer: Self-pay | Admitting: Internal Medicine

## 2020-12-21 ENCOUNTER — Other Ambulatory Visit: Payer: Self-pay | Admitting: Internal Medicine

## 2021-01-27 ENCOUNTER — Other Ambulatory Visit: Payer: Self-pay | Admitting: Internal Medicine

## 2021-02-17 ENCOUNTER — Other Ambulatory Visit: Payer: Self-pay | Admitting: Internal Medicine

## 2021-02-24 ENCOUNTER — Other Ambulatory Visit: Payer: Self-pay | Admitting: Internal Medicine

## 2021-03-01 ENCOUNTER — Encounter: Payer: Self-pay | Admitting: Internal Medicine

## 2021-03-01 ENCOUNTER — Other Ambulatory Visit: Payer: Self-pay

## 2021-03-01 ENCOUNTER — Ambulatory Visit (INDEPENDENT_AMBULATORY_CARE_PROVIDER_SITE_OTHER): Payer: BC Managed Care – PPO | Admitting: Internal Medicine

## 2021-03-01 DIAGNOSIS — N401 Enlarged prostate with lower urinary tract symptoms: Secondary | ICD-10-CM | POA: Diagnosis not present

## 2021-03-01 DIAGNOSIS — R3911 Hesitancy of micturition: Secondary | ICD-10-CM | POA: Diagnosis not present

## 2021-03-01 DIAGNOSIS — K219 Gastro-esophageal reflux disease without esophagitis: Secondary | ICD-10-CM | POA: Diagnosis not present

## 2021-03-01 DIAGNOSIS — F39 Unspecified mood [affective] disorder: Secondary | ICD-10-CM | POA: Diagnosis not present

## 2021-03-01 MED ORDER — OMEPRAZOLE 40 MG PO CPDR: 40.0000 mg | DELAYED_RELEASE_CAPSULE | Freq: Every day | ORAL | 3 refills | Status: DC

## 2021-03-01 MED ORDER — CITALOPRAM HYDROBROMIDE 20 MG PO TABS
20.0000 mg | ORAL_TABLET | Freq: Every day | ORAL | 3 refills | Status: DC
Start: 2021-03-01 — End: 2021-03-24

## 2021-03-01 MED ORDER — TERAZOSIN HCL 5 MG PO CAPS: 5.0000 mg | ORAL_CAPSULE | Freq: Every day | ORAL | 1 refills | Status: DC

## 2021-03-01 NOTE — Patient Instructions (Addendum)
We have sent in the celexa to use daily as needed for the mood.  We have sent in terazosin for the prostate to take daily.   We have refilled the omeprazole.

## 2021-03-01 NOTE — Progress Notes (Signed)
   Subjective:   Patient ID: [name] [name], male    DOB: 09-Jul-1975, 45 y.o.   MRN: [id number]  HPI The patient is a 45 YO man coming in for several concerns.   Review of Systems  Constitutional: Negative.   HENT: Negative.    Eyes: Negative.   Respiratory:  Negative for cough, chest tightness and shortness of breath.   Cardiovascular:  Negative for chest pain, palpitations and leg swelling.  Gastrointestinal:  Negative for abdominal distention, abdominal pain, constipation, diarrhea, nausea and vomiting.       GERD  Genitourinary:        Change in stream  Musculoskeletal: Negative.   Skin: Negative.   Neurological: Negative.   Psychiatric/Behavioral: Negative.     Objective:  Physical Exam Constitutional:      Appearance: He is well-developed.  HENT:     Head: Normocephalic and atraumatic.  Cardiovascular:     Rate and Rhythm: Normal rate and regular rhythm.  Pulmonary:     Effort: Pulmonary effort is normal. No respiratory distress.     Breath sounds: Normal breath sounds. No wheezing or rales.  Abdominal:     General: Bowel sounds are normal. There is no distension.     Palpations: Abdomen is soft.     Tenderness: There is no abdominal tenderness. There is no rebound.  Musculoskeletal:     Cervical back: Normal range of motion.  Skin:    General: Skin is warm and dry.  Neurological:     Mental Status: He is alert and oriented to person, place, and time.     Coordination: Coordination normal.    Vitals:   03/01/21 1602  BP: 122/80  Pulse: 67  Resp: 18  Temp: 98.4 F (36.9 C)  TempSrc: Oral  SpO2: 98%  Weight: 211 lb 3.2 oz (95.8 kg)  Height: 5\' 8"  (1.727 m)    This visit occurred during the SARS-CoV-2 public health emergency.  Safety protocols were in place, including screening questions prior to the visit, additional usage of staff PPE, and extensive cleaning of exam room while observing appropriate contact time as indicated for disinfecting solutions.    Assessment & Plan:

## 2021-03-04 DIAGNOSIS — N400 Enlarged prostate without lower urinary tract symptoms: Secondary | ICD-10-CM | POA: Insufficient documentation

## 2021-03-04 NOTE — Assessment & Plan Note (Signed)
Rx terazosin as he has tried flomax (rx by urology after assessment).

## 2021-03-04 NOTE — Assessment & Plan Note (Signed)
Rx omeprazole 40mg daily

## 2021-03-04 NOTE — Assessment & Plan Note (Signed)
Buspar was not effective and needs daily control. Rx celexa to use.

## 2021-03-10 ENCOUNTER — Telehealth: Payer: Self-pay | Admitting: Internal Medicine

## 2021-03-10 NOTE — Telephone Encounter (Signed)
See below

## 2021-03-10 NOTE — Telephone Encounter (Signed)
  Seeking advice/ alternative medication Patient calling to report nasal congestion has started since taking  terazosin (HYTRIN) 5 MG capsule

## 2021-03-11 NOTE — Telephone Encounter (Signed)
Depending on level of symptoms can try zyrtec to see if this helps.

## 2021-03-14 NOTE — Telephone Encounter (Signed)
Called patient. LVM with Dr. Crawford's recommendations. Office number was provided.  

## 2021-03-24 ENCOUNTER — Other Ambulatory Visit: Payer: Self-pay | Admitting: Internal Medicine

## 2021-04-14 ENCOUNTER — Other Ambulatory Visit: Payer: Self-pay | Admitting: Internal Medicine

## 2021-08-10 ENCOUNTER — Ambulatory Visit (INDEPENDENT_AMBULATORY_CARE_PROVIDER_SITE_OTHER): Payer: BC Managed Care – PPO | Admitting: Internal Medicine

## 2021-08-10 ENCOUNTER — Encounter: Payer: Self-pay | Admitting: Internal Medicine

## 2021-08-10 ENCOUNTER — Other Ambulatory Visit: Payer: Self-pay

## 2021-08-10 VITALS — BP 122/90 | HR 70 | Resp 18 | Ht 68.0 in | Wt 214.2 lb

## 2021-08-10 DIAGNOSIS — R55 Syncope and collapse: Secondary | ICD-10-CM | POA: Diagnosis not present

## 2021-08-10 LAB — COMPREHENSIVE METABOLIC PANEL
ALT: 27 U/L (ref 0–53)
AST: 26 U/L (ref 0–37)
Albumin: 4.5 g/dL (ref 3.5–5.2)
Alkaline Phosphatase: 63 U/L (ref 39–117)
BUN: 12 mg/dL (ref 6–23)
CO2: 26 mEq/L (ref 19–32)
Calcium: 9.8 mg/dL (ref 8.4–10.5)
Chloride: 104 mEq/L (ref 96–112)
Creatinine, Ser: 1 mg/dL (ref 0.40–1.50)
GFR: 90.65 mL/min (ref >60.00)
Glucose, Bld: 109 mg/dL — ABNORMAL HIGH (ref 70–99)
Potassium: 4.1 mEq/L (ref 3.5–5.1)
Sodium: 142 mEq/L (ref 135–145)
Total Bilirubin: 0.4 mg/dL (ref 0.2–1.2)
Total Protein: 7.3 g/dL (ref 6.0–8.3)

## 2021-08-10 LAB — CBC
HCT: 43.6 % (ref 39.0–52.0)
Hemoglobin: 14.8 g/dL (ref 13.0–17.0)
MCHC: 33.9 g/dL (ref 30.0–36.0)
MCV: 88.6 fl (ref 78.0–100.0)
Platelets: 187 10*3/uL (ref 150.0–400.0)
RBC: 4.93 Mil/uL (ref 4.22–5.81)
RDW: 13.2 % (ref 11.5–15.5)
WBC: 5.9 10*3/uL (ref 4.0–10.5)

## 2021-08-10 LAB — LIPID PANEL
Cholesterol: 189 mg/dL (ref 0–200)
HDL: 44.9 mg/dL (ref >39.00)
NonHDL: 144.05
Total CHOL/HDL Ratio: 4
Triglycerides: 296 mg/dL — ABNORMAL HIGH (ref 0.0–149.0)
VLDL: 59.2 mg/dL — ABNORMAL HIGH (ref 0.0–40.0)

## 2021-08-10 LAB — VITAMIN D 25 HYDROXY (VIT D DEFICIENCY, FRACTURES): VITD: 13.16 ng/mL — ABNORMAL LOW (ref 30.00–100.00)

## 2021-08-10 LAB — HEMOGLOBIN A1C: Hgb A1c MFr Bld: 5.8 % (ref 4.6–6.5)

## 2021-08-10 LAB — TSH: TSH: 1.94 u[IU]/mL (ref 0.35–5.50)

## 2021-08-10 LAB — VITAMIN B12: Vitamin B-12: 330 pg/mL (ref 211–911)

## 2021-08-10 LAB — LDL CHOLESTEROL, DIRECT: Direct LDL: 122 mg/dL

## 2021-08-10 NOTE — Patient Instructions (Signed)
The EKG of the heart is normal.  We will check the labs.

## 2021-08-10 NOTE — Progress Notes (Signed)
° °  Subjective:   Patient ID: [name] [name], male    DOB: 10-14-1975, 46 y.o.   MRN: [id number]  HPI The patient is a 46 YO man coming in for pre-syncopal episodes. Multiple in a day lasting a few seconds. Was not sure if BP was high or low. Denies change in diet or medications or otc.   Review of Systems  Constitutional: Negative.   HENT: Negative.    Eyes: Negative.   Respiratory:  Negative for cough, chest tightness and shortness of breath.   Cardiovascular:  Negative for chest pain, palpitations and leg swelling.  Gastrointestinal:  Negative for abdominal distention, abdominal pain, constipation, diarrhea, nausea and vomiting.  Musculoskeletal: Negative.   Skin: Negative.   Neurological:  Positive for light-headedness.  Psychiatric/Behavioral: Negative.     Objective:  Physical Exam Constitutional:      Appearance: He is well-developed.  HENT:     Head: Normocephalic and atraumatic.  Cardiovascular:     Rate and Rhythm: Normal rate and regular rhythm.  Pulmonary:     Effort: Pulmonary effort is normal. No respiratory distress.     Breath sounds: Normal breath sounds. No wheezing or rales.  Abdominal:     General: Bowel sounds are normal. There is no distension.     Palpations: Abdomen is soft.     Tenderness: There is no abdominal tenderness. There is no rebound.  Musculoskeletal:     Cervical back: Normal range of motion.  Skin:    General: Skin is warm and dry.  Neurological:     Mental Status: He is alert and oriented to person, place, and time.     Coordination: Coordination normal.    Vitals:   08/10/21 0955  BP: 122/90  Pulse: 70  Resp: 18  SpO2: 97%  Weight: 214 lb 3.2 oz (97.2 kg)  Height: 5\' 8"  (1.727 m)   EKG: Rate 77, axis normal, interval normal, sinus, no st or t wave changes, no prior to compare  This visit occurred during the SARS-CoV-2 public health emergency.  Safety protocols were in place, including screening questions prior to the visit,  additional usage of staff PPE, and extensive cleaning of exam room while observing appropriate contact time as indicated for disinfecting solutions.   Assessment & Plan:

## 2021-08-11 ENCOUNTER — Telehealth: Payer: Self-pay | Admitting: Internal Medicine

## 2021-08-11 NOTE — Telephone Encounter (Signed)
Results have not been read by provider. I will contact the patient once I have the results.

## 2021-08-11 NOTE — Telephone Encounter (Signed)
Pt checking status of 08-10-2021 lab results, provider has not resulted labs as of yet  Pt requesting a c/b for results

## 2021-08-12 ENCOUNTER — Other Ambulatory Visit: Payer: Self-pay | Admitting: Internal Medicine

## 2021-08-12 MED ORDER — VITAMIN D (ERGOCALCIFEROL) 1.25 MG (50000 UNIT) PO CAPS: 50000.0000 [IU] | ORAL_CAPSULE | ORAL | 0 refills | Status: DC

## 2021-08-12 NOTE — Telephone Encounter (Signed)
Pt has called with concerns about his lab results of his lipid panel as his Triglycerides are "through the roof" and wants to know will you be sending in a medication for this and wants suggestions on what to do as far as diet chang to help reduce these numbers.  *Pt states only one issue was addressed with his labs due to Vitamin D being sent in.  **I did advise pt to cut out/back on fried foods, oily food, processed foods and mets, nothing canned. Also, advised pt to eat more fresh veg, fruits and use an alternative to butter, use EVVO or avocado oil to prepare foods.  Pt number is [phone].

## 2021-08-13 DIAGNOSIS — R55 Syncope and collapse: Secondary | ICD-10-CM | POA: Insufficient documentation

## 2021-08-13 NOTE — Assessment & Plan Note (Signed)
EKG done without changes. Checking TSH, CBC, CMP, vitamin D, vitamin B12, HgA1c. Treat as appropriate. If no results could consider brain imaging versus holter monitor. Advised to drink plenty of fluids to see if this helps.

## 2021-08-14 ENCOUNTER — Encounter: Payer: Self-pay | Admitting: Internal Medicine

## 2021-08-15 MED ORDER — FENOFIBRATE 145 MG PO TABS: 160.0000 mg | ORAL_TABLET | Freq: Every day | ORAL | 3 refills | Status: DC

## 2021-08-15 NOTE — Telephone Encounter (Signed)
Since this was not a fasting cholesterol it is hard to judge how accurate the triglycerides are. For triglycerides consistently above 250 would require we consider medicine to treat. His was slightly above 250 so I would recommend to work on diet and exercise and next time we check it should be fasting to we can see how they are.

## 2021-09-05 ENCOUNTER — Other Ambulatory Visit: Payer: Self-pay | Admitting: Internal Medicine

## 2021-12-24 ENCOUNTER — Other Ambulatory Visit: Payer: Self-pay | Admitting: Internal Medicine

## 2022-01-13 ENCOUNTER — Other Ambulatory Visit: Payer: Self-pay | Admitting: Internal Medicine

## 2022-06-06 ENCOUNTER — Other Ambulatory Visit: Payer: Self-pay | Admitting: Internal Medicine

## 2022-08-30 ENCOUNTER — Ambulatory Visit: Payer: BC Managed Care – PPO | Admitting: Internal Medicine

## 2022-08-31 ENCOUNTER — Telehealth: Payer: Self-pay | Admitting: Internal Medicine

## 2022-08-31 NOTE — Telephone Encounter (Signed)
Patient has an appointment scheduled for 09/06/22 for recent seizures and needs a referral for neurology. He would like to know if he can get a referral sent in sooner than his appointment due to the new situation with the multiple seizures. He would like a call back to let him know if that is possible at [phone].

## 2022-09-01 ENCOUNTER — Ambulatory Visit (INDEPENDENT_AMBULATORY_CARE_PROVIDER_SITE_OTHER): Payer: BC Managed Care – PPO | Admitting: Internal Medicine

## 2022-09-01 ENCOUNTER — Encounter: Payer: Self-pay | Admitting: Internal Medicine

## 2022-09-01 VITALS — BP 122/82 | HR 86 | Temp 98.4°F | Ht 68.0 in | Wt 198.0 lb

## 2022-09-01 DIAGNOSIS — R569 Unspecified convulsions: Secondary | ICD-10-CM | POA: Diagnosis not present

## 2022-09-01 DIAGNOSIS — R42 Dizziness and giddiness: Secondary | ICD-10-CM

## 2022-09-01 DIAGNOSIS — R55 Syncope and collapse: Secondary | ICD-10-CM

## 2022-09-01 NOTE — Telephone Encounter (Signed)
Patient rescheduled for 09/04/22 to see Hendricks Limes, but would still like to know if Dr. Sharlet Salina can see or speak to him sooner.

## 2022-09-01 NOTE — Telephone Encounter (Signed)
Before visit we need to know if patient has gone to anywhere for assessment of this. If so we need those records if possible prior to visit. If he has not new multiple seizures we need to know when last seizure was. If today needs ER instead as new, ongoing seizures is not appropriate for outpatient and could be an emergency.

## 2022-09-01 NOTE — Patient Instructions (Signed)
We will get the heart monitor to you and the neurologist.

## 2022-09-01 NOTE — Progress Notes (Signed)
Subjective:   Patient ID: [name] [name], male    DOB: July 15, 1975, 47 y.o.   MRN: [id number]  HPI The patient is a 47 YO man coming in for urgent visit. Multiple seizure like episodes recently. Was in [place] for concert and felt weird, fell down and shaking seizuring on ground for >5 minutes less than 10 minutes. EMS evaluated and he decided to stay and try to get to concert, had 2-3 more episodes lasting less time before concert started and he went to ER. ER did labs and CT head and no findings. No medication was given. He went back and sleep in the hotel for rest of day and night. Next day they were trying to drive home to [facility]. Stopped for food and there he had several seizure episodes. Some prodrome symptoms and he knew it was coming on could not stop. They went again to ER a different town/hospital and got MRI with contrast no findings and carotid artery check without obstruction (they were told minimal plaque). They saw a telehealth neurologist they were not overly satisfied with (lack of thoroughness and bedside manner) and were prescribed keppra which he started taking. No major seizures since starting this but several episodes of staring and being confused. He is slightly tired and not as sharp this week.   Did not drink any alcohol or any substances day of seizures.   He is also down 15 pounds in the last 1-2 months. Some lack of appetite even in recent weeks. Some change in appetite since these episodes. No stomach issues such as nausea/vomiting/diarrhea/constipation. No chest pains or SOB. No cough. Had covid-19 about 5 weeks ago mild and recovered well. No other recent changes. Did change job/roles in last 6 months and his job is much more stressful now.   PMH, [facility], social history reviewed and updated  Review of Systems  Constitutional:  Positive for activity change, appetite change and unexpected weight change.  HENT: Negative.    Eyes: Negative.   Respiratory:  Negative for  cough, chest tightness and shortness of breath.   Cardiovascular:  Negative for chest pain, palpitations and leg swelling.  Gastrointestinal:  Negative for abdominal distention, abdominal pain, constipation, diarrhea, nausea and vomiting.  Musculoskeletal: Negative.   Skin:  Positive for wound.  Neurological:  Positive for dizziness and seizures.  Psychiatric/Behavioral:  Positive for decreased concentration and dysphoric mood.     Objective:  Physical Exam Constitutional:      Appearance: He is well-developed.  HENT:     Head: Normocephalic.     Comments: Wounds on face from recent seizure events Cardiovascular:     Rate and Rhythm: Normal rate and regular rhythm.  Pulmonary:     Effort: Pulmonary effort is normal. No respiratory distress.     Breath sounds: Normal breath sounds. No wheezing or rales.  Abdominal:     General: Bowel sounds are normal. There is no distension.     Palpations: Abdomen is soft.     Tenderness: There is no abdominal tenderness. There is no rebound.  Musculoskeletal:     Cervical back: Normal range of motion.  Skin:    General: Skin is warm and dry.  Neurological:     Mental Status: He is alert and oriented to person, place, and time.     Cranial Nerves: No cranial nerve deficit.     Sensory: No sensory deficit.     Coordination: Coordination normal.     Vitals:   09/01/22 1609  BP: 122/82  Pulse: 86  Temp: 98.4 F (36.9 C)  TempSrc: Oral  SpO2: 97%  Weight: 198 lb (89.8 kg)  Height: '5\' 8"'$  (1.727 m)   EKG: Rate 77, axis normal, interval normal, sinus, no st or t wave changes, no significant change compared to prior 20   Assessment & Plan:  Visit time 35 minutes in face to face communication with patient and coordination of care, additional 5 minutes spent in record review, coordination or care, ordering tests, communicating/referring to other healthcare professionals, documenting in medical records all on the same day of the visit for  total time 40 minutes spent on the visit.

## 2022-09-01 NOTE — Assessment & Plan Note (Signed)
Sounds to be seizure activity unclear if triggered by seizure or cardiac event. He did not have EEG at any facility. He has been started on keppra 1000 mg BID with episodes about 5 days ago. We will get records and urgent neurology referral. EKG done to rule out changes which is stable. Ordered holter monitor to help [place] rule out cardiac cause of the brain activity. He sounds to have some absence type symptoms this week on keppra and it is unclear if he is seizure free. We discussed that without the records I cannot say for sure what testing was done or not done or the appropriateness of the workup. For any overt seizure activity they are advised to call 911. Advised that CT/MRI imaging would have cleared seen a brain tumor or bleed (from the initial fall/seizure) or aneurysm. Discussed next steps. Reviewed [place] DMV rules about driving after a seizure. He is not on any medications known to cause seizure (celexa could change seizure threshold but he is not known to have seizures).

## 2022-09-03 ENCOUNTER — Observation Stay (HOSPITAL_COMMUNITY): Payer: BC Managed Care – PPO

## 2022-09-03 ENCOUNTER — Inpatient Hospital Stay (HOSPITAL_COMMUNITY)
Admission: EM | Admit: 2022-09-03 | Discharge: 2022-09-07 | DRG: 101 | Disposition: A | Discharge status: Home / Self Care | Payer: BC Managed Care – PPO | Attending: Internal Medicine | Admitting: Internal Medicine

## 2022-09-03 ENCOUNTER — Emergency Department (HOSPITAL_COMMUNITY): Payer: BC Managed Care – PPO

## 2022-09-03 ENCOUNTER — Other Ambulatory Visit: Payer: Self-pay

## 2022-09-03 DIAGNOSIS — Z1152 Encounter for screening for COVID-19: Secondary | ICD-10-CM

## 2022-09-03 DIAGNOSIS — I10 Essential (primary) hypertension: Secondary | ICD-10-CM | POA: Diagnosis present

## 2022-09-03 DIAGNOSIS — G40909 Epilepsy, unspecified, not intractable, without status epilepticus: Secondary | ICD-10-CM | POA: Diagnosis not present

## 2022-09-03 DIAGNOSIS — Z6830 Body mass index (BMI) 30.0-30.9, adult: Secondary | ICD-10-CM

## 2022-09-03 DIAGNOSIS — Z885 Allergy status to narcotic agent status: Secondary | ICD-10-CM

## 2022-09-03 DIAGNOSIS — K219 Gastro-esophageal reflux disease without esophagitis: Secondary | ICD-10-CM

## 2022-09-03 DIAGNOSIS — R000 Tachycardia, unspecified: Secondary | ICD-10-CM | POA: Diagnosis not present

## 2022-09-03 DIAGNOSIS — R404 Transient alteration of awareness: Principal | ICD-10-CM

## 2022-09-03 DIAGNOSIS — E7800 Pure hypercholesterolemia, unspecified: Secondary | ICD-10-CM | POA: Diagnosis present

## 2022-09-03 DIAGNOSIS — R634 Abnormal weight loss: Secondary | ICD-10-CM

## 2022-09-03 DIAGNOSIS — Z79899 Other long term (current) drug therapy: Secondary | ICD-10-CM

## 2022-09-03 DIAGNOSIS — Z8249 Family history of ischemic heart disease and other diseases of the circulatory system: Secondary | ICD-10-CM

## 2022-09-03 DIAGNOSIS — F419 Anxiety disorder, unspecified: Secondary | ICD-10-CM | POA: Diagnosis present

## 2022-09-03 DIAGNOSIS — Z62810 Personal history of physical and sexual abuse in childhood: Secondary | ICD-10-CM

## 2022-09-03 DIAGNOSIS — R569 Unspecified convulsions: Secondary | ICD-10-CM

## 2022-09-03 DIAGNOSIS — R079 Chest pain, unspecified: Secondary | ICD-10-CM | POA: Diagnosis not present

## 2022-09-03 DIAGNOSIS — R55 Syncope and collapse: Secondary | ICD-10-CM

## 2022-09-03 DIAGNOSIS — E669 Obesity, unspecified: Secondary | ICD-10-CM | POA: Diagnosis present

## 2022-09-03 DIAGNOSIS — F17290 Nicotine dependence, other tobacco product, uncomplicated: Secondary | ICD-10-CM | POA: Diagnosis present

## 2022-09-03 LAB — RAPID URINE DRUG SCREEN, HOSP PERFORMED
Amphetamines: NOT DETECTED
Barbiturates: NOT DETECTED
Benzodiazepines: NOT DETECTED
Cocaine: NOT DETECTED
Opiates: NOT DETECTED
Tetrahydrocannabinol: POSITIVE — AB

## 2022-09-03 LAB — CBC WITH DIFFERENTIAL/PLATELET
Abs Immature Granulocytes: 0.03 10*3/uL (ref 0.00–0.07)
Basophils Absolute: 0.1 10*3/uL (ref 0.0–0.1)
Basophils Relative: 1 %
Eosinophils Absolute: 0.1 10*3/uL (ref 0.0–0.5)
Eosinophils Relative: 1 %
HCT: 42.6 % (ref 39.0–52.0)
Hemoglobin: 15 g/dL (ref 13.0–17.0)
Immature Granulocytes: 0 %
Lymphocytes Relative: 24 %
Lymphs Abs: 2 10*3/uL (ref 0.7–4.0)
MCH: 30.7 pg (ref 26.0–34.0)
MCHC: 35.2 g/dL (ref 30.0–36.0)
MCV: 87.1 fL (ref 80.0–100.0)
Monocytes Absolute: 0.9 10*3/uL (ref 0.1–1.0)
Monocytes Relative: 12 %
Neutro Abs: 5 10*3/uL (ref 1.7–7.7)
Neutrophils Relative %: 62 %
Platelets: 220 10*3/uL (ref 150–400)
RBC: 4.89 MIL/uL (ref 4.22–5.81)
RDW: 12.4 % (ref 11.5–15.5)
WBC: 8.2 10*3/uL (ref 4.0–10.5)
nRBC: 0 % (ref 0.0–0.2)

## 2022-09-03 LAB — COMPREHENSIVE METABOLIC PANEL
ALT: 28 U/L (ref 0–44)
AST: 26 U/L (ref 15–41)
Albumin: 4.2 g/dL (ref 3.5–5.0)
Alkaline Phosphatase: 84 U/L (ref 38–126)
Anion gap: 11 (ref 5–15)
BUN: 13 mg/dL (ref 6–20)
CO2: 21 mmol/L — ABNORMAL LOW (ref 22–32)
Calcium: 9.8 mg/dL (ref 8.9–10.3)
Chloride: 104 mmol/L (ref 98–111)
Creatinine, Ser: 0.95 mg/dL (ref 0.61–1.24)
GFR, Estimated: >60 mL/min (ref >60)
Glucose, Bld: 111 mg/dL — ABNORMAL HIGH (ref 70–99)
Potassium: 3.9 mmol/L (ref 3.5–5.1)
Sodium: 136 mmol/L (ref 135–145)
Total Bilirubin: 0.4 mg/dL (ref 0.3–1.2)
Total Protein: 7.1 g/dL (ref 6.5–8.1)

## 2022-09-03 LAB — AMMONIA: Ammonia: 24 umol/L (ref 9–35)

## 2022-09-03 LAB — ETHANOL: Alcohol, Ethyl (B): <10 mg/dL (ref <10)

## 2022-09-03 LAB — TROPONIN I (HIGH SENSITIVITY): Troponin I (High Sensitivity): 3 ng/L (ref <18)

## 2022-09-03 LAB — CBG MONITORING, ED: Glucose-Capillary: 111 mg/dL — ABNORMAL HIGH (ref 70–99)

## 2022-09-03 MED ORDER — LEVETIRACETAM 500 MG PO TABS
1000.0000 mg | ORAL_TABLET | Freq: Two times a day (BID) | ORAL | Status: DC
  Administered 2022-09-03 – 2022-09-04 (×2): 1000 mg via ORAL
  Filled 2022-09-03 (×2): qty 2

## 2022-09-03 MED ORDER — ZOLPIDEM TARTRATE 5 MG PO TABS
10.0000 mg | ORAL_TABLET | Freq: Every evening | ORAL | Status: DC | PRN
  Administered 2022-09-04 – 2022-09-07 (×4): 10 mg via ORAL
  Filled 2022-09-03 (×4): qty 2

## 2022-09-03 NOTE — ED Triage Notes (Signed)
Pt bib [facility] from home where he had about a 5 minute witnessed "focal/absent" seizure. Pt started having multiple seizures a week ago and was started on keppra on Wednesday. Pt states he has been taking medication as prescribed. But has had multiple of these episodes since the first time on Sunday.  Pt has seen his PCP but was waiting on a neuro appointment.  EMS vitals: 128/84, 18R, 99% RA, 113CBG

## 2022-09-03 NOTE — Assessment & Plan Note (Signed)
Had a fall with prodrome and then had a seizure like episode followed by some more less typical seizure-like activity currently taking Keppra we will continue. Appreciate neurology consult patient reportedly had an MRI of the head which was without contrast at outside facility no results available but he was told it was normal Will obtain EEG Seizure precautions Monitor on telemetry Differential includes seizures, pseudoseizures, syncope

## 2022-09-03 NOTE — Subjective & Objective (Addendum)
Pt comes from home with recurrent episodes of seizure-like activity started about a week ago he originally was traveling to [place] when he had seizure-like activity he went to an outside facility had an MRI/ wo contrast done which reportedly was normal and was started on Keppra was on Wednesday he has been taking prior prescribed medications.  But continues to have episodes he has been seen by his primary care provider and awaiting for neurology follow-up. His blood sugar was 113 blood pressure 128/84 Family describes seizure-like activity associated loss of consciousness tonic-clonic shaking but when he wakes up he seems to be able to be alert and able to speak to her although may be slightly more confused than his baseline may have a little bit of a brain fog.  Episodes associated tongue biting.  His Keppra dose been of 1000 mg twice daily He has been feeling fairly tired all day Wife states the episodes last between few seconds to few minutes today seem to have slightly different episode not associated tonic-clonic shaking but more active staring spell and patient was a little bit more confused after this episode. No family history of seizures but has family with early onset strokes Patient denies alcohol abuse

## 2022-09-03 NOTE — Assessment & Plan Note (Signed)
Continue protonix 40 mg po q day

## 2022-09-03 NOTE — Consult Note (Signed)
NEUROLOGY CONSULTATION NOTE   Date of service: September 03, 2022 Patient Name: [name] [name] MRN:  [id number] DOB:  1976/04/26 Reason for consult: "seizure like activity" Requesting Provider: Wyvonnia Dusky, MD _ _ _   _ __   _ __ _ _  __ __   _ __   __ _  History of Present Illness  [name] [name] is a 47 y.o. male with PMH significant for GERD, depression, anxiety who presents with episodes concerning for potential seizures vs syncope.  Episode started while he was out on a trip to a concert in [place].  He was standing in line, endorses dizziness following which he collapsed and had a seizure.  Per wife, this is the first time he is ever had a seizure.  This was on 27 August 2022.  It lasted about 7 to 12 minutes after the seizure, he was back to his baseline.  He reports while seated in the concert, he had a couple more episodes concerning for seizure.  The next day, he was fine and he did not have any seizures but was also in the bed and is able to tell the entire day.  On 27th, they were driving while patient had episode of staring off with no shaking and he was little confused after the episode.  He had a second episode in the pulled into the nearest hospital where he was kept for 2 days and he was started on Keppra. He endorses very little to no postictal period following these episodes but he does feel tired and little out of it the next day.  They returned to [facility] and were waiting on a neurology appointment but over the last 2 days, he is at at least 1-2 episodes a day of staring off.  He was brought in for further evaluation and workup.  Denies any prior history of seizures, no family history of epilepsy, endorses it 2 years ago, he had significant head injury with loss of consciousness but recovered pretty well from that.  Denies any history of strokes, no history of ICH, no history of brain cancer.  He drinks a glass of whiskey about 3 nights out of the week.   He endorses using marijuana very regularly.  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  He denies any chest pain, no chest pressure, no palpitations, he is not aware when he is having these episodes.  Wife is also noted that over the last few days, he would randomly open his mouth and smacking his lips.    ROS   Constitutional Denies weight loss, fever and chills.   HEENT Denies changes in vision and hearing.   Respiratory Denies SOB and cough.   CV Denies palpitations and CP   GI Denies abdominal pain, nausea, vomiting and diarrhea.   GU Denies dysuria and urinary frequency.   MSK Denies myalgia and joint pain.   Skin Denies rash and pruritus.   Neurological Denies headache and syncope.   Psychiatric Denies recent changes in mood. Denies anxiety and depression.    Past History   Past Medical History:  Diagnosis Date   Anxiety    Depression    GERD (gastroesophageal reflux disease)    Past Surgical History:  Procedure Laterality Date   FOOT SURGERY Left 1997   repaired tendon   Family History  Problem Relation Age of Onset  Stroke Mother    Hypertension Mother    Arthritis Mother    Heart disease Brother    Stroke Maternal Grandmother    Stroke Paternal Grandmother    Social History   Socioeconomic History   Marital status: Married    Spouse name: Not on file   Number of children: Not on file   Years of education: Not on file   Highest education level: Not on file  Occupational History   Not on file  Tobacco Use   Smoking status: Former    Types: Cigarettes    Quit date: 07/03/2008    Years since quitting: 14.1   Smokeless tobacco: Never  Substance and Sexual Activity   Alcohol use: Yes    Comment: social   Drug use: No   Sexual activity: Not on file  Other Topics Concern   Not on file  Social History Narrative   Not on file   Social Determinants of  Health   Financial Resource Strain: Not on file  Food Insecurity: Not on file  Transportation Needs: Not on file  Physical Activity: Not on file  Stress: Not on file  Social Connections: Not on file   Allergies  Allergen Reactions   Hydrocodone     Medications  (Not in a hospital admission)    Vitals   Vitals:   09/03/22 1959 09/03/22 2118  BP: (!) 136/98 (!) 141/94  Pulse: 77 87  Resp: 18 (!) 21  Temp: 98.6 F (37 C)   TempSrc: Oral   SpO2: 98% 96%     There is no height or weight on file to calculate BMI.  Physical Exam   General: Laying comfortably in bed; in no acute distress.  HENT: Normal oropharynx and mucosa. Normal external appearance of ears and nose.  Neck: Supple, no pain or tenderness  CV: No JVD. No peripheral edema.  Pulmonary: Symmetric Chest rise. Normal respiratory effort.  Abdomen: Soft to touch, non-tender.  Ext: No cyanosis, edema, or deformity  Skin: No rash. Normal palpation of skin.   Musculoskeletal: Normal digits and nails by inspection. No clubbing.   Neurologic Examination  Mental status/Cognition: Alert, oriented to self, place, month and year, good attention.  Speech/language: Fluent, comprehension intact, object naming intact, repetition intact.  Cranial nerves:   CN II Pupils equal and reactive to light, no VF deficits    CN III,IV,VI EOM intact, no gaze preference or deviation, no nystagmus    CN V normal sensation in V1, V2, and V3 segments bilaterally    CN VII no asymmetry, no nasolabial fold flattening    CN VIII normal hearing to speech    CN IX & X normal palatal elevation, no uvular deviation    CN XI 5/5 head turn and 5/5 shoulder shrug bilaterally    CN XII midline tongue protrusion    Motor:  Muscle bulk: normal, tone normal, pronator drift normal tremor none Mvmt Root Nerve  Muscle Right Left Comments  SA C5/6 Ax Deltoid 5 5   EF C5/6 Mc Biceps 5 5   EE C6/7/8 Rad Triceps 5 5   WF C6/7 Med FCR     WE C7/8 PIN  ECU     F Ab C8/T1 U ADM/FDI 5 5   HF L1/2/3 Fem Illopsoas 5 5   KE L2/3/4 Fem Quad 5 5   DF L4/5 D Peron Tib Ant 5 5   PF S1/2 Tibial Grc/Sol 5 5    Sensation:  Light  touch Intact throughout   Pin prick    Temperature    Vibration   Proprioception    Coordination/Complex Motor:  - Finger to Nose intact bilaterally - Heel to shin intact bilaterally - Rapid alternating movement are normal - Gait: Deferred for patient's safety. Labs   CBC:  Recent Labs  Lab 09/03/22 1949  WBC 8.2  NEUTROABS 5.0  HGB 15.0  HCT 42.6  MCV 87.1  PLT [id number]    Basic Metabolic Panel:  Lab Results  Component Value Date   NA 136 09/03/2022   K 3.9 09/03/2022   CO2 21 (L) 09/03/2022   GLUCOSE 111 (H) 09/03/2022   BUN 13 09/03/2022   CREATININE 0.95 09/03/2022   CALCIUM 9.8 09/03/2022   GFRNONAA >60 09/03/2022   GFRAA >90 03/31/2013   Lipid Panel: No results found for: "LDLCALC" HgbA1c:  Lab Results  Component Value Date   HGBA1C 5.8 08/10/2021   Urine Drug Screen: No results found for: "LABOPIA", "COCAINSCRNUR", "LABBENZ", "AMPHETMU", "THCU", "LABBARB"  Alcohol Level     Component Value Date/Time   ETH <10 09/03/2022 1949    CT Head without contrast(Personally reviewed): CTH was negative for a large hypodensity concerning for a large territory infarct or hyperdensity concerning for an ICH  MRI Brain: Unable to get records from OSH so far.  cEEG: Pending  Impression   [name] [name] is a 47 y.o. male with PMH significant for GERD, depression, anxiety who presents with episodes concerning for potential seizures vs syncope. Started 1 week ago whtn he had a GTC episode with no post ictal period. Over the last few days about 1-2 episodes of starring off where he is poorly responsive but also random episodes of lip smacking. No obvious seizure risk factors but has been undergoing tremendouse amount of stress 2/2 work and not getting along well with significant other.  The  description of the episodes specially with little to no post ictal period and obvious lack of any seizure risk factors and the fact that these episode are occurring despite Keppra, is somewhat concerning for non epileptic events. However, not entirely sure since I have not witnessed and we have not been able to characterize these on EEG so far.  Given that these are occurring about 1-2 times a day, I think it is reasonable to get him up on LTM EEG for spell capture with hope that we can capture one and characterize on LTM EEG.  Recommendations  - LTM EEG in AM. - continue Keppra tonight, may consider holding if no spells on cEEG. Also reports that he feels that his temper is running short over the last couple of days, I think Keppra may not be the best agent for him. - Seizure precautions with seizure pads. ______________________________________________________________________   Thank you for the opportunity to take part in the care of this patient. If you have any further questions, please contact the neurology consultation attending.  Signed,  [place] Pager Number [phone] _ _ _   _ __   _ __ _ _  __ __   _ __   __ _

## 2022-09-03 NOTE — ED Notes (Signed)
Patient transported to CT 

## 2022-09-03 NOTE — Assessment & Plan Note (Addendum)
Unclear if some of his episodes are more syncopal-like.  Patient does describe some prodrome prior to having this episode. Obtain echogram monitor on telemetry troponin unremarkable Check lipid panel Patient would benefit from further follow-up with cardiology and possibly monitor insertion Check D-dimer

## 2022-09-03 NOTE — ED Provider Notes (Signed)
[facility] Provider Note   CSN: [id number] Arrival date & time: 09/03/22  1948     History  Chief Complaint  Patient presents with   Seizures    [name] [name] is a 47 y.o. male presenting to the ED with concern for syncopal episodes and seizure-like activity.  Supplemental history of [place] by the patient's wife.  They report that approximate 1 week ago and the patient was in [place] for a concert he had what appeared to be a seizure-like activity, with loss of consciousness and generalized tonic-clonic shaking.  He went to the concert and subsequently had several more episodes like this.  His wife reports that immediately after these episodes the patient to be alert and speaking to her.  They were taken to a local hospital where he had brain imaging, and were advised to seek neurology care when he arrived back in his home here in [place].  However 2 days after this concert, the patient again had seizure-like activity, was taken to another rural local hospital in [place], where they report he was admitted for an MRI scan of the brain.  They were told the MRI was normal.  There was started on Keppra 1000 mg twice daily which she has been compliant with.  He has been extremely fatigued all day.  His wife reports that he had another episode earlier this week as well as today, and in each case, the patient becomes "gray and pale and his eyes start to look dark, and he says he feels like he is going to go out".  He is diaphoretic during these episodes.  They can last a few seconds to minutes.  Today's episode did not have any actual tonic-clonic shaking.  The patient was more confused afterwards.  The patient denies any personal or family history of seizures.  He denies any other significant medical problems.  He denies daily alcohol use or alcohol withdrawal.  He is here with his wife and 2 children  HPI     Home Medications Prior to  Admission medications   Medication Sig Start Date End Date Taking? Authorizing Provider  citalopram (CELEXA) 20 MG tablet TAKE 1 TABLET BY MOUTH EVERY DAY 03/24/21   [name], MD  fenofibrate (TRICOR) 145 MG tablet Take 1 tablet (145 mg total) by mouth daily. 08/15/21   [name], MD  levETIRAcetam (KEPPRA) 1000 MG tablet Take 1,000 mg by mouth 2 (two) times daily. 08/31/22   [provider]  omeprazole (PRILOSEC) 40 MG capsule TAKE 1 CAPSULE (40 MG TOTAL) BY MOUTH DAILY. 12/26/21   [name], MD  sildenafil (REVATIO) 20 MG tablet TAKE ONE TABLET BY MOUTH 3 TIMES DAILY 07/24/19   [name], MD  terazosin (HYTRIN) 5 MG capsule Take 1 capsule (5 mg total) by mouth at bedtime. 03/01/21   [name], MD  Vitamin D, Ergocalciferol, (DRISDOL) 1.25 MG (50000 UNIT) CAPS capsule Take 1 capsule (50,000 Units total) by mouth every 7 (seven) days. 08/12/21   [name], MD  zolpidem (AMBIEN) 10 MG tablet TAKE 1 TABLET BY MOUTH AT BEDTIME AS NEEDED FOR SLEEP.INS ALLOWS 15TAB/25 DAYS 06/07/22   [name], MD      Allergies    Hydrocodone    Review of Systems   Review of Systems  Physical Exam Updated Vital Signs BP (!) 141/94 (BP Location: Right Arm)   Pulse 87   Temp 98.6 F (  37 C) (Oral)   Resp (!) 21   SpO2 96%  Physical Exam Constitutional:      General: He is not in acute distress. HENT:     Head: Normocephalic and atraumatic.  Eyes:     Conjunctiva/sclera: Conjunctivae normal.     Pupils: Pupils are equal, round, and reactive to light.  Cardiovascular:     Rate and Rhythm: Normal rate and regular rhythm.  Pulmonary:     Effort: Pulmonary effort is normal. No respiratory distress.  Abdominal:     General: There is no distension.     Tenderness: There is no abdominal tenderness.  Skin:    General: Skin is warm and dry.  Neurological:     General: No focal deficit present.     Mental Status: He is  alert and oriented to person, place, and time. Mental status is at baseline.  Psychiatric:        Mood and Affect: Mood normal.        Behavior: Behavior normal.     ED Results / Procedures / Treatments   Labs (all labs ordered are listed, but only abnormal results are displayed) Labs Reviewed  COMPREHENSIVE METABOLIC PANEL - Abnormal; Notable for the following components:      Result Value   CO2 21 (*)    Glucose, Bld 111 (*)    All other components within normal limits  RAPID URINE DRUG SCREEN, HOSP PERFORMED - Abnormal; Notable for the following components:   Tetrahydrocannabinol POSITIVE (*)    All other components within normal limits  CBG MONITORING, ED - Abnormal; Notable for the following components:   Glucose-Capillary 111 (*)    All other components within normal limits  AMMONIA  CBC WITH DIFFERENTIAL/PLATELET  ETHANOL  LEVETIRACETAM LEVEL  TROPONIN I (HIGH SENSITIVITY)    EKG EKG Interpretation  Date/Time:  Sunday September 03 2022 19:49:38 EST Ventricular Rate:  72 PR Interval:  168 QRS Duration: 91 QT Interval:  372 QTC Calculation: 408 R Axis:   37 Text Interpretation: Sinus rhythm Low voltage, precordial leads Confirmed by Octaviano Glow [phone]) on 09/03/2022 7:53:14 PM  Radiology CT Head Wo Contrast  Result Date: 09/03/2022 CLINICAL DATA:  Seizure. EXAM: CT HEAD WITHOUT CONTRAST TECHNIQUE: Contiguous axial images were obtained from the base of the skull through the vertex without intravenous contrast. RADIATION DOSE REDUCTION: This exam was performed according to the departmental dose-optimization program which includes automated exposure control, adjustment of the mA and/or kV according to patient size and/or use of iterative reconstruction technique. COMPARISON:  None Available. FINDINGS: Brain: The ventricles and sulci are appropriate size for the patient's age. The gray-white matter discrimination is preserved. There is no acute intracranial hemorrhage. No  mass effect or midline shift. No extra-axial fluid collection. Vascular: No hyperdense vessel or unexpected calcification. Skull: Normal. Negative for fracture or focal lesion. Sinuses/Orbits: Diffuse mucoperiosteal thickening of paranasal sinuses. No air-fluid level. The mastoid air cells are clear. Other: None IMPRESSION: 1. No acute intracranial pathology. 2. Paranasal sinus disease. Electronically Signed   By: Anner Crete M.D.   On: 09/03/2022 20:40    Procedures Procedures    Medications Ordered in ED Medications  levETIRAcetam (KEPPRA) tablet 1,000 mg (1,000 mg Oral Given 09/03/22 2117)  zolpidem (AMBIEN) tablet 10 mg (has no administration in time range)    ED Course/ Medical Decision Making/ A&P Clinical Course as of 09/03/22 2240  Sun Sep 03, 2022  2210 Dr Lorrin Goodell to see patient.  Plan for  medical admission for echo, tele monitoring, neuro consult. [MT]  2240 Admitted to hospitalist dr Roel Cluck [MT]    Clinical Course User Index [MT] Langston Masker Carola Rhine, MD                             Medical Decision Making Amount and/or Complexity of Data Reviewed Labs: ordered. Radiology: ordered.  Risk Prescription drug management. Decision regarding hospitalization.   This patient presents to the ED with concern for seizure-like activity, transient syncope versus near syncope. This involves an extensive number of treatment options, and is a complaint that carries with it a high risk of complications and morbidity.  The differential diagnosis includes seizures versus arrhythmia versus metabolic derangement versus other  Additional history obtained from the patient's wife at bedside  I ordered and personally interpreted labs.  The pertinent results include: No emergent findings.  UDS is positive for THC.  I ordered imaging studies including CT scan of the head I independently visualized and interpreted imaging which showed no emergent findings I agree with the radiologist  interpretation  The patient was maintained on a cardiac monitor.  I personally viewed and interpreted the cardiac monitored which showed an underlying rhythm of: Sinus rhythm  Per my interpretation the patient's ECG shows sinus rhythm no acute ischemic findings  I ordered medication including home dose Keppra ordered per patient's evening regimen  I have reviewed the patients home medicines and have made adjustments as needed  Test Considered: Low suspicion for meningitis or SAH at this time.  No emergent indication for lumbar puncture  I requested consultation with the neurology,  and discussed lab and imaging findings as well as pertinent plan - they recommend: Consult is pending at the time of admission.  After the interventions noted above, I reevaluated the patient and found that they have: stayed the same   Dispostion:  After consideration of the diagnostic results and the patients response to treatment, I feel that the patent would benefit from medical admission         Final Clinical Impression(s) / ED Diagnoses Final diagnoses:  Seizure-like activity [facility])    Rx / DC Orders ED Discharge Orders     None         Ethanael Veith, Carola Rhine, MD 09/03/22 2240

## 2022-09-03 NOTE — H&P (Signed)
[name] [name] [id number] DOB: 1975/11/05 DOA: 09/03/2022   PCP: [name], MD   Outpatient Specialists:  NONE    Patient arrived to ER on 09/03/22 at 1948 Referred by Attending Trifan, Carola Rhine, MD   Patient coming from:    home Live With family    Chief Complaint:   Chief Complaint  Patient presents with   Seizures    HPI: [name] [name] is a 47 y.o. male with medical history significant of recent seizure like activity, past hx of high cholesterol, and HTN no longer takes any meds    Presented with confusion staring spells Pt comes from home with recurrent episodes of seizure-like activity started about a week ago he originally was traveling to [place] when he had seizure-like activity he went to an outside facility had an MRI done which reportedly was normal and was started on Keppra was on Wednesday he has been taking prior prescribed medications.  But continues to have episodes he has been seen by his primary care provider and awaiting for neurology follow-up. His blood sugar was 113 blood pressure 128/84 Family describes seizure-like activity associated loss of consciousness tonic-clonic shaking but when he wakes up he seems to be able to be alert and able to speak to her although may be slightly more confused than his baseline may have a little bit of a brain fog.  Episodes associated tongue biting.  His Keppra dose been of 1000 mg twice daily He has been feeling fairly tired all day Wife states the episodes last between few seconds to few minutes today seem to have slightly different episode not associated tonic-clonic shaking but more active staring spell and patient was a little bit more confused after this episode. No family history of seizures Patient denies alcohol abuse The first episode appeared about a week ago when he was in [place] for concert felt a prodrome fell down had a shaking seizure-like episode on the ground for about 5 minutes EMS  evaluated him but patient wanted to continue going onto the concert had an hour or 2 of cream more episodes while waiting for consult to start and eventually went to emergency department he had labs done and CT head which was unremarkable initially patient was discharged from ER went to hotel and next time they will attempted to drive back home but then had more seizure episodes patient felt like he could feel it coming on.  [place] with contrast and was told it was normal and that the carotid arteries was without obstruction minimal plaque only this is a telehealth neurologist there and was prescribed Keppra fossa milligrams twice daily since then he stopped having shaking episodes but still has occasional staring episodes and no more confusion  He has been losing some weight for the past few months decreased appetite no chest pain or shortness of breath no cough had a COVID episode 5 weeks ago  Does not smoke, drinks 2-3 drinks per week\  He is hot tub technician   He reports he can feel it coming on, describes it like a V sensation He had one while in bed, felt overwhelmed hot and sweaty and then feels very cold  Tonight he was more clammy and was more confused Has harder time finding words  Sometimes he starts to smack lips before this happens  Tonight he felt it was way worse he was more Short of breath   His  job has been extremely stressful  Regarding pertinent Chronic problems:     Hyperlipidemia -  no longer on   tricor Lipid Panel     Component Value Date/Time   CHOL 189 08/10/2021 1050   TRIG 296.0 (H) 08/10/2021 1050   HDL 44.90 08/10/2021 1050   CHOLHDL 4 08/10/2021 1050   VLDL 59.2 (H) 08/10/2021 1050   LDLDIRECT 122.0 08/10/2021 1050   She show activity recently was started on Keppra 1000 mg twice daily   HTN on no longer on terazosin         While in ER: Clinical Course as of 09/03/22 2253  Sun Sep 03, 2022  2210 Dr  Lorrin Goodell to see patient.  Plan for medical admission for echo, tele monitoring, neuro consult. [MT]  2240 Admitted to hospitalist dr Roel Cluck [MT]    Clinical Course User Index [MT] Wyvonnia Dusky, MD     Ordered  CT HEAD   No acute intracranial pathology. 2. Paranasal sinus disease.   CXR - non acute    Following Medications were ordered in ER: Medications  levETIRAcetam (KEPPRA) tablet 1,000 mg (1,000 mg Oral Given 09/03/22 2117)  zolpidem (AMBIEN) tablet 10 mg (has no administration in time range)    _______________________________________________________ ER Provider Called:  Neurology    Dr. Philomena Doheny They Recommend admit to medicine    SEEN in ER   ED Triage Vitals [09/03/22 1959]  Enc Vitals Group     BP (!) 136/98     Pulse Rate 77     Resp 18     Temp 98.6 F (37 C)     Temp Source Oral     SpO2 98 %     Weight      Height      Head Circumference      Peak Flow      Pain Score 0     Pain Loc      Pain Edu?      Excl. in [place]?   [id number]     _________________________________________ Significant initial  Findings: Abnormal Labs Reviewed  COMPREHENSIVE METABOLIC PANEL - Abnormal; Notable for the following components:      Result Value   CO2 21 (*)    Glucose, Bld 111 (*)    All other components within normal limits  RAPID URINE DRUG SCREEN, HOSP PERFORMED - Abnormal; Notable for the following components:   Tetrahydrocannabinol POSITIVE (*)    All other components within normal limits  CBG MONITORING, ED - Abnormal; Notable for the following components:   Glucose-Capillary 111 (*)    All other components within normal limits    _________________________ Troponin  3-4 ECG: Ordered Personally reviewed and interpreted by me showing: HR : 72 Rhythm:Sinus rhythm Low voltage, precordial leads QTC 408   The recent clinical data is shown below. Vitals:   09/03/22 1959 09/03/22 2118  BP: (!) 136/98 (!) 141/94  Pulse: 77 87  Resp: 18 (!) 21  Temp:  98.6 F (37 C)   TempSrc: Oral   SpO2: 98% 96%     WBC     Component Value Date/Time   WBC 8.2 09/03/2022 1949   LYMPHSABS 2.0 09/03/2022 1949   MONOABS 0.9 09/03/2022 1949   EOSABS 0.1 09/03/2022 1949   BASOSABS 0.1 09/03/2022 1949     Lactic Acid, Venous     UA  ordered      Results for orders placed or performed in visit on 12/24/18  Novel Coronavirus,  NAA ([facility])     Status: None   Collection Time: 12/24/18 12:10 PM  Result Value Ref Range Status   SARS-CoV-2, NAA Not Detected Not Detected Final    Comment: Testing was performed using the cobas(R) SARS-CoV-2 test. This test was developed and its performance characteristics determined by [employer]. This test has not been FDA cleared or approved. This test has been authorized by FDA under an Emergency Use Authorization (EUA). This test is only authorized for the duration of time the declaration that circumstances exist justifying the authorization of the emergency use of in vitro diagnostic tests for detection of SARS-CoV-2 virus and/or diagnosis of COVID-19 infection under section 564(b)(1) of the Act, 21 U.S.C. KA:123727), unless the authorization is terminated or revoked sooner. When diagnostic testing is negative, the possibility of a false negative result should be considered in the context of a patient's recent exposures and the presence of clinical signs and symptoms consistent with COVID-19. An individual without symptoms of COVID-19 and who is not shedding SARS-CoV-2 virus would expect to have a negati ve (not detected) result in this assay.     _______________________________________________ Hospitalist was called for admission for   Seizure-like activity [facility])    The following Work up has been ordered so far:  Orders Placed This Encounter  Procedures   CT Head Wo Contrast   Ammonia   Comprehensive metabolic panel   CBC with Differential   Ethanol   Rapid urine drug screen (hospital  performed)   Levetiracetam level   Consult to neurology  Seizure like activity   Consult to hospitalist   POC CBG, ED   EKG 12-Lead     OTHER Significant initial  Findings:  labs showing:   Recent Labs  Lab 09/03/22 1949  NA 136  K 3.9  CO2 21*  GLUCOSE 111*  BUN 13  CREATININE 0.95  CALCIUM 9.8    Cr  stable,   Lab Results  Component Value Date   CREATININE 0.95 09/03/2022   CREATININE 1.00 08/10/2021   CREATININE 1.15 04/18/2019    Recent Labs  Lab 09/03/22 1949  AST 26  ALT 28  ALKPHOS 84  BILITOT 0.4  PROT 7.1  ALBUMIN 4.2   Lab Results  Component Value Date   CALCIUM 9.8 09/03/2022          Plt: Lab Results  Component Value Date   PLT 220 09/03/2022       Recent Labs  Lab 09/03/22 1949  WBC 8.2  NEUTROABS 5.0  HGB 15.0  HCT 42.6  MCV 87.1  PLT 220    HG/HCT stable,     Component Value Date/Time   HGB 15.0 09/03/2022 1949   HCT 42.6 09/03/2022 1949   MCV 87.1 09/03/2022 1949    Recent Labs  Lab 09/03/22 2014  AMMONIA 24      CBG (last 3)  Recent Labs    09/03/22 2019  GLUCAP 111*        Cultures: No results found for: "SDES", "SPECREQUEST", "CULT", "REPTSTATUS"   Radiological Exams on Admission: CT Head Wo Contrast  Result Date: 09/03/2022 CLINICAL DATA:  Seizure. EXAM: CT HEAD WITHOUT CONTRAST TECHNIQUE: Contiguous axial images were obtained from the base of the skull through the vertex without intravenous contrast. RADIATION DOSE REDUCTION: This exam was performed according to the departmental dose-optimization program which includes automated exposure control, adjustment of the mA and/or kV according to patient size and/or use of iterative reconstruction technique. COMPARISON:  None Available.  FINDINGS: Brain: The ventricles and sulci are appropriate size for the patient's age. The gray-white matter discrimination is preserved. There is no acute intracranial hemorrhage. No mass effect or midline shift. No extra-axial  fluid collection. Vascular: No hyperdense vessel or unexpected calcification. Skull: Normal. Negative for fracture or focal lesion. Sinuses/Orbits: Diffuse mucoperiosteal thickening of paranasal sinuses. No air-fluid level. The mastoid air cells are clear. Other: None IMPRESSION: 1. No acute intracranial pathology. 2. Paranasal sinus disease. Electronically Signed   By: Anner Crete M.D.   On: 09/03/2022 20:40   _______________________________________________________________________________________________________ Latest  Blood pressure (!) 141/94, pulse 87, temperature 98.6 F (37 C), temperature source Oral, resp. rate (!) 21, SpO2 96 %.   Vitals  labs and radiology finding personally reviewed  Review of Systems:    Pertinent positives include:  fatigue, weight loss   Constitutional:  No weight loss, night sweats, Fevers, chills,  HEENT:  No headaches, Difficulty swallowing,Tooth/dental problems,Sore throat,  No sneezing, itching, ear ache, nasal congestion, post nasal drip,  Cardio-vascular:  No chest pain, Orthopnea, PND, anasarca, dizziness, palpitations.no Bilateral lower extremity swelling  GI:  No heartburn, indigestion, abdominal pain, nausea, vomiting, diarrhea, change in bowel habits, loss of appetite, melena, blood in stool, hematemesis Resp:  no shortness of breath at rest. No dyspnea on exertion, No excess mucus, no productive cough, No non-productive cough, No coughing up of blood.No change in color of mucus.No wheezing. Skin:  no rash or lesions. No jaundice GU:  no dysuria, change in color of urine, no urgency or frequency. No straining to urinate.  No flank pain.  Musculoskeletal:  No joint pain or no joint swelling. No decreased range of motion. No back pain.  Psych:  No change in mood or affect. No depression or anxiety. No memory loss.  Neuro: no localizing neurological complaints, no tingling, no weakness, no double vision, no gait abnormality, no slurred  speech, no confusion  All systems reviewed and apart from [place] all are negative _______________________________________________________________________________________________ Past Medical History:   Past Medical History:  Diagnosis Date   Anxiety    Depression    GERD (gastroesophageal reflux disease)     Past Surgical History:  Procedure Laterality Date   FOOT SURGERY Left 1997   repaired tendon    Social History:  Ambulatory   independently    reports that he quit smoking about 14 years ago. He has never used smokeless tobacco. He reports current alcohol use. He reports that he does not use drugs.    Family History:  Both parents had CVA before they were 75 Family History  Problem Relation Age of Onset   Stroke Mother    Hypertension Mother    Arthritis Mother    Heart disease Brother    Stroke Maternal Grandmother    Stroke Paternal Grandmother    ______________________________________________________________________________________________ Allergies: Allergies  Allergen Reactions   Hydrocodone      Prior to Admission medications   Medication Sig Start Date End Date Taking? Authorizing Provider  citalopram (CELEXA) 20 MG tablet TAKE 1 TABLET BY MOUTH EVERY DAY 03/24/21   [name], MD  fenofibrate (TRICOR) 145 MG tablet Take 1 tablet (145 mg total) by mouth daily. 08/15/21   [name], MD  levETIRAcetam (KEPPRA) 1000 MG tablet Take 1,000 mg by mouth 2 (two) times daily. 08/31/22   [provider]  omeprazole (PRILOSEC) 40 MG capsule TAKE 1 CAPSULE (40 MG TOTAL) BY MOUTH DAILY. 12/26/21   [name], MD  sildenafil (REVATIO) 20 MG tablet TAKE ONE TABLET BY MOUTH 3 TIMES DAILY 07/24/19   [name], MD  terazosin (HYTRIN) 5 MG capsule Take 1 capsule (5 mg total) by mouth at bedtime. 03/01/21   [name], MD  Vitamin D, Ergocalciferol, (DRISDOL) 1.25 MG (50000 UNIT) CAPS capsule Take 1 capsule (50,000  Units total) by mouth every 7 (seven) days. 08/12/21   [name], MD  zolpidem (AMBIEN) 10 MG tablet TAKE 1 TABLET BY MOUTH AT BEDTIME AS NEEDED FOR SLEEP.INS ALLOWS 15TAB/25 DAYS 06/07/22   [name], MD    ___________________________________________________________________________________________________ Physical Exam:    09/03/2022    9:18 PM 09/03/2022    7:59 PM 09/01/2022    4:09 PM  Vitals with BMI  Height   '5\' 8"'$   Weight   198 lbs  BMI   [id number]  Systolic [id number]  Diastolic 94 98 82  Pulse 87 77 86    1. General:  in No  Acute distress   Chronically ill  -appearing 2. Psychological: Alert and   Oriented 3. Head/ENT:    Dry Mucous Membranes                          Head Non traumatic, neck supple                           Poor Dentition 4. SKIN:  decreased Skin turgor,  Skin clean Dry and intact no rash 5. Heart: Regular rate and rhythm no  Murmur, no Rub or gallop 6. Lungs Clear to auscultation bilaterally, no wheezes or crackles   7. Abdomen: Soft,  non-tender, Non distended bowel sounds present 8. Lower extremities: no clubbing, cyanosis, no  edema 9. Neurologically  strength 5 out of 5 in all 4 extremities cranial nerves II through XII intact 10. MSK: Normal range of motion    Chart has been reviewed  ______________________________________________________________________________________________  Assessment/Plan 47 y.o. male with medical history significant of recent seizure like activity, past hx of high cholesterol, and HTN no longer takes any meds   Admitted for  Seizure-like activity  vs syncope      Present on Admission:  Pre-syncope  GERD (gastroesophageal reflux disease)  Weight loss     Seizure disorder ([place]) Had a fall with prodrome and then had a seizure like episode followed by some more less typical seizure-like activity currently taking Keppra we will continue. Appreciate neurology consult patient reportedly had an MRI of  the head which was without contrast at outside facility no results available but he was told it was normal Will obtain EEG Seizure precautions Monitor on telemetry Differential includes seizures, pseudoseizures, syncope  Pre-syncope Unclear if some of his episodes are more syncopal-like.  Patient does describe some prodrome prior to having this episode. Obtain echogram monitor on telemetry troponin unremarkable Check lipid panel Patient would benefit from further follow-up with cardiology and possibly monitor insertion Check D-dimer  GERD (gastroesophageal reflux disease) Continue protonix 40 mg po q day  Weight loss Patient will need further workup chest x-ray unremarkable Abdomen soft nontender No evidence of anemia Advised patient that he may need a colonoscopy as an outpatient He also has been under severe stress which could contributed to weight loss Check TSH   Other plan as per orders.  DVT prophylaxis:  SCD      Code Status:    Code Status:  Not on file FULL CODE as per patient   I had personally discussed CODE STATUS with patient and family      Family Communication:   Family  at  Bedside  plan of care was discussed   with   Daughter,   Disposition Plan:       To home once workup is complete and patient is stable   Following barriers for discharge:                           Seizure vs syncope work up                           Will need consultants to evaluate patient prior to discharge    Consults called: neurology is awre, will email cardiology    Admission status:  ED Disposition     ED Disposition  [place]: [place] [[id number]]  Level of Care: Telemetry Cardiac [[id number]]  May place patient in observation at [facility] or [place] if equivalent level of care is available:: No  Covid Evaluation: Asymptomatic - no recent exposure (last 10 days) testing not required  Diagnosis: [id number]  Admitting Physician: Toy Baker [[id number]]  Attending Physician: Toy Baker [[id number]]          Obs       Level of care     tele  For  24H      Santo Zahradnik 09/03/2022, 11:41 PM    [facility]     after 2 AM please page floor coverage PA If 7AM-7PM, please contact the day team taking care of the patient using [email or web address]   Patient was evaluated in the context of the global COVID-19 pandemic, which necessitated consideration that the patient might be at risk for infection with the SARS-CoV-2 virus that causes COVID-19. Institutional protocols and algorithms that pertain to the evaluation of patients at risk for COVID-19 are in a state of rapid change based on information released by regulatory bodies including the CDC and federal and state organizations. These policies and algorithms were followed during the patient's care.

## 2022-09-03 NOTE — ED Notes (Signed)
Shift report received, assumed care of patient at this time.

## 2022-09-03 NOTE — Assessment & Plan Note (Signed)
Patient will need further workup chest x-ray unremarkable Abdomen soft nontender No evidence of anemia Advised patient that he may need a colonoscopy as an outpatient He also has been under severe stress which could contributed to weight loss Check TSH

## 2022-09-03 NOTE — ED Notes (Signed)
ED TO INPATIENT HANDOFF REPORT  ED Nurse Name and Phone #: Mosie Lukes RN [phone]  S Name/Age/Gender [name] [name] 47 y.o. male Room/Bed: 017C/017C  Code Status   Code Status: Full Code  Home/SNF/Other Home Patient oriented to: self, place, time, and situation Is this baseline? Yes   Triage Complete: Triage complete  Chief Complaint Seizure disorder [facility] Iv) [id number]  Triage Note Pt bib [facility] from home where he had about a 5 minute witnessed "focal/absent" seizure. Pt started having multiple seizures a week ago and was started on keppra on Wednesday. Pt states he has been taking medication as prescribed. But has had multiple of these episodes since the first time on Sunday.  Pt has seen his PCP but was waiting on a neuro appointment.  EMS vitals: 128/84, 18R, 99% RA, 113CBG   Allergies Allergies  Allergen Reactions   Hydrocodone     Hyper     Level of Care/Admitting Diagnosis ED Disposition     ED Disposition  Admit   Condition  --   [place]: [place] [[id number]]  Level of Care: Telemetry Cardiac [[id number]]  May place patient in observation at [facility] or [facility] if equivalent level of care is available:: No  Covid Evaluation: Asymptomatic - no recent exposure (last 10 days) testing not required  [facility]osis: [id number]  Admitting Physician: [place], [place]  Attending Physician: Toy Baker [[id number]]          B Medical/Surgery History Past Medical History:  Diagnosis Date   Anxiety    Depression    GERD (gastroesophageal reflux disease)    Past Surgical History:  Procedure Laterality Date   FOOT SURGERY Left 1997   repaired tendon     A IV Location/Drains/Wounds Patient Lines/Drains/Airways Status     Active Line/Drains/Airways     Name Placement date Placement time Site Days   Peripheral IV 09/03/22 20 G Anterior;Left Forearm 09/03/22  2000  Forearm  less than 1             Intake/Output Last 24 hours No intake or output data in the 24 hours ending 09/03/22 2307  Labs/Imaging Results for orders placed or performed during the hospital encounter of 09/03/22 (from the past 48 hour(s))  Comprehensive metabolic panel     Status: Abnormal   Collection Time: 09/03/22  7:49 PM  Result Value Ref Range   Sodium 136 135 - 145 mmol/L   Potassium 3.9 3.5 - 5.1 mmol/L   Chloride 104 98 - 111 mmol/L   CO2 21 (L) 22 - 32 mmol/L   Glucose, Bld 111 (H) 70 - 99 mg/dL    Comment: Glucose reference range applies only to samples taken after fasting for at least 8 hours.   BUN 13 6 - 20 mg/dL   Creatinine, Ser 0.95 0.61 - 1.24 mg/dL   Calcium 9.8 8.9 - 10.3 mg/dL   Total Protein 7.1 6.5 - 8.1 g/dL   Albumin 4.2 3.5 - 5.0 g/dL   AST 26 15 - 41 U/L   ALT 28 0 - 44 U/L   Alkaline Phosphatase 84 38 - 126 U/L   Total Bilirubin 0.4 0.3 - 1.2 mg/dL   GFR, Estimated >60 >60 mL/min    Comment: (NOTE) Calculated using the CKD-EPI Creatinine Equation (2021)    Anion gap 11 5 - 15    Comment: Performed at [facility] [address], [place], [place] [address]  CBC with  Differential     Status: None   Collection Time: 09/03/22  7:49 PM  Result Value Ref Range   WBC 8.2 4.0 - 10.5 K/uL   RBC 4.89 4.22 - 5.81 MIL/uL   Hemoglobin 15.0 13.0 - 17.0 g/dL   HCT 42.6 39.0 - 52.0 %   MCV 87.1 80.0 - 100.0 fL   MCH 30.7 26.0 - 34.0 pg   MCHC 35.2 30.0 - 36.0 g/dL   RDW 12.4 11.5 - 15.5 %   Platelets 220 150 - 400 K/uL   nRBC 0.0 0.0 - 0.2 %   Neutrophils Relative % 62 %   Neutro Abs 5.0 1.7 - 7.7 K/uL   Lymphocytes Relative 24 %   Lymphs Abs 2.0 0.7 - 4.0 K/uL   Monocytes Relative 12 %   Monocytes Absolute 0.9 0.1 - 1.0 K/uL   Eosinophils Relative 1 %   Eosinophils Absolute 0.1 0.0 - 0.5 K/uL   Basophils Relative 1 %   Basophils Absolute 0.1 0.0 - 0.1 K/uL   Immature Granulocytes 0 %   Abs Immature Granulocytes 0.03 0.00 - 0.07 K/uL    Comment:  Performed at [facility], [address], [place], [place] [address]  Troponin I (High Sensitivity)     Status: None   Collection Time: 09/03/22  7:49 PM  Result Value Ref Range   Troponin I (High Sensitivity) 3 <18 ng/L    Comment: (NOTE) Elevated high sensitivity troponin I (hsTnI) values and significant  changes across serial measurements may suggest ACS but many other  chronic and acute conditions are known to elevate hsTnI results.  Refer to the "Links" section for chest pain algorithms and additional  guidance. Performed at [facility], [place] [address], [place], [place] [address]   Ethanol     Status: None   Collection Time: 09/03/22  7:49 PM  Result Value Ref Range   Alcohol, Ethyl (B) <10 <10 mg/dL    Comment: (NOTE) Lowest detectable limit for serum alcohol is 10 mg/dL.  For medical purposes only. Performed at [facility], [place] [address], [place], [place] [address]   Rapid urine drug screen (hospital performed)     Status: Abnormal   Collection Time: 09/03/22  8:12 PM  Result Value Ref Range   Opiates NONE DETECTED NONE DETECTED   Cocaine NONE DETECTED NONE DETECTED   Benzodiazepines NONE DETECTED NONE DETECTED   Amphetamines NONE DETECTED NONE DETECTED   Tetrahydrocannabinol POSITIVE (A) NONE DETECTED   Barbiturates NONE DETECTED NONE DETECTED    Comment: (NOTE) DRUG SCREEN FOR MEDICAL PURPOSES ONLY.  IF CONFIRMATION IS NEEDED FOR ANY PURPOSE, NOTIFY LAB WITHIN 5 DAYS.  LOWEST DETECTABLE LIMITS FOR URINE DRUG SCREEN Drug Class                     Cutoff (ng/mL) Amphetamine and metabolites    1000 Barbiturate and metabolites    200 Benzodiazepine                 200 Opiates and metabolites        300 Cocaine and metabolites        300 THC                            50 Performed at [facility], [place] [address], [place],  [address]   Ammonia     Status: None  Collection Time: 09/03/22  8:14 PM  Result Value Ref Range    Ammonia 24 9 - 35 umol/L    Comment: Performed at [facility], [place] [address], [place], [place] [address]  POC CBG, ED     Status: Abnormal   Collection Time: 09/03/22  8:19 PM  Result Value Ref Range   Glucose-Capillary 111 (H) 70 - 99 mg/dL    Comment: Glucose reference range applies only to samples taken after fasting for at least 8 hours.   CT Head Wo Contrast  Result Date: 09/03/2022 CLINICAL DATA:  Seizure. EXAM: CT HEAD WITHOUT CONTRAST TECHNIQUE: Contiguous axial images were obtained from the base of the skull through the vertex without intravenous contrast. RADIATION DOSE REDUCTION: This exam was performed according to the departmental dose-optimization program which includes automated exposure control, adjustment of the mA and/or kV according to patient size and/or use of iterative reconstruction technique. COMPARISON:  None Available. FINDINGS: Brain: The ventricles and sulci are appropriate size for the patient's age. The gray-white matter discrimination is preserved. There is no acute intracranial hemorrhage. No mass effect or midline shift. No extra-axial fluid collection. Vascular: No hyperdense vessel or unexpected calcification. Skull: Normal. Negative for fracture or focal lesion. Sinuses/Orbits: Diffuse mucoperiosteal thickening of paranasal sinuses. No air-fluid level. The mastoid air cells are clear. Other: None IMPRESSION: 1. No acute intracranial pathology. 2. Paranasal sinus disease. Electronically Signed   By: Anner Crete M.D.   On: 09/03/2022 20:40    Pending Labs Unresulted Labs (From admission, onward)     Start     Ordered   09/04/22 0500  Prealbumin  Tomorrow morning,   R        09/03/22 2255   09/03/22 2256  CK  Add-on,   AD        09/03/22 2255   09/03/22 2256  Lactic acid, plasma  STAT Now then every 3 hours,   R (with STAT occurrences)     Question:  Release to patient  Answer:  Immediate   09/03/22 2255   09/03/22 2256  Protime-INR  Once,   R        Question:  Release to patient  Answer:  Immediate   09/03/22 2255   09/03/22 2256  Magnesium  Add-on,   AD        09/03/22 2255   09/03/22 2256  Phosphorus  Add-on,   AD        09/03/22 2255   09/03/22 2256  TSH  Add-on,   AD        09/03/22 2255   09/03/22 2256  Urinalysis, Complete w Microscopic -Urine, Clean Catch  Once,   R       Question Answer Comment  Release to patient Immediate   Specimen Source Urine, Clean Catch      09/03/22 2255   09/03/22 2256  Sedimentation rate  Add-on,   AD        09/03/22 2255   09/03/22 2256  C-reactive protein  Add-on,   AD        09/03/22 2255   09/03/22 2107  Levetiracetam level  Once,   URGENT        09/03/22 2107   Signed and Held  HIV Antibody (routine testing w rflx)  (HIV Antibody (Routine testing w reflex) panel)  Once,   R        Signed and Held   Signed and Held  Comprehensive metabolic panel  Tomorrow morning,  R       Question:  Release to patient  Answer:  Immediate   Signed and Held   Signed and Held  CBC  Tomorrow morning,   R       Question:  Release to patient  Answer:  Immediate   Signed and Held   Signed and Held  Magnesium  Tomorrow morning,   R        Signed and Held   Signed and Held  Phosphorus  Tomorrow morning,   R        Signed and Held            Vitals/Pain Today's Vitals   09/03/22 1959 09/03/22 2118  BP: (!) 136/98 (!) 141/94  Pulse: 77 87  Resp: 18 (!) 21  Temp: 98.6 F (37 C)   TempSrc: Oral   SpO2: 98% 96%  PainSc: 0-No pain     Isolation Precautions No active isolations  Medications Medications  levETIRAcetam (KEPPRA) tablet 1,000 mg (1,000 mg Oral Given 09/03/22 2117)  zolpidem (AMBIEN) tablet 10 mg (has no administration in time range)    Mobility walks     Focused Assessments Neuro Assessment Handoff:  Swallow screen pass? Yes          Neuro Assessment: Within Defined Limits Neuro Checks:      Has TPA been given? No If patient is a Neuro Trauma and patient is going  to OR before floor call report to [place] nurse: [phone] or [phone]   R Recommendations: See Admitting Provider Note  Report given to:   Additional Notes: A&O x 4, ambulates without assistance

## 2022-09-04 ENCOUNTER — Observation Stay (INDEPENDENT_AMBULATORY_CARE_PROVIDER_SITE_OTHER): Payer: BC Managed Care – PPO

## 2022-09-04 ENCOUNTER — Encounter (HOSPITAL_COMMUNITY): Payer: Self-pay | Admitting: Internal Medicine

## 2022-09-04 ENCOUNTER — Observation Stay (HOSPITAL_COMMUNITY): Payer: BC Managed Care – PPO

## 2022-09-04 ENCOUNTER — Ambulatory Visit: Payer: BC Managed Care – PPO | Admitting: Family Medicine

## 2022-09-04 DIAGNOSIS — Z62810 Personal history of physical and sexual abuse in childhood: Secondary | ICD-10-CM | POA: Diagnosis not present

## 2022-09-04 DIAGNOSIS — R569 Unspecified convulsions: Secondary | ICD-10-CM

## 2022-09-04 DIAGNOSIS — R079 Chest pain, unspecified: Secondary | ICD-10-CM | POA: Diagnosis not present

## 2022-09-04 DIAGNOSIS — Z885 Allergy status to narcotic agent status: Secondary | ICD-10-CM | POA: Diagnosis not present

## 2022-09-04 DIAGNOSIS — R404 Transient alteration of awareness: Secondary | ICD-10-CM | POA: Diagnosis not present

## 2022-09-04 DIAGNOSIS — R000 Tachycardia, unspecified: Secondary | ICD-10-CM | POA: Diagnosis not present

## 2022-09-04 DIAGNOSIS — E669 Obesity, unspecified: Secondary | ICD-10-CM | POA: Diagnosis present

## 2022-09-04 DIAGNOSIS — R55 Syncope and collapse: Secondary | ICD-10-CM

## 2022-09-04 DIAGNOSIS — R42 Dizziness and giddiness: Secondary | ICD-10-CM | POA: Diagnosis not present

## 2022-09-04 DIAGNOSIS — K219 Gastro-esophageal reflux disease without esophagitis: Secondary | ICD-10-CM | POA: Diagnosis present

## 2022-09-04 DIAGNOSIS — G40909 Epilepsy, unspecified, not intractable, without status epilepticus: Secondary | ICD-10-CM | POA: Diagnosis present

## 2022-09-04 DIAGNOSIS — I10 Essential (primary) hypertension: Secondary | ICD-10-CM | POA: Diagnosis present

## 2022-09-04 DIAGNOSIS — E7800 Pure hypercholesterolemia, unspecified: Secondary | ICD-10-CM | POA: Diagnosis present

## 2022-09-04 DIAGNOSIS — F17290 Nicotine dependence, other tobacco product, uncomplicated: Secondary | ICD-10-CM | POA: Diagnosis present

## 2022-09-04 DIAGNOSIS — Z6830 Body mass index (BMI) 30.0-30.9, adult: Secondary | ICD-10-CM | POA: Diagnosis not present

## 2022-09-04 DIAGNOSIS — F419 Anxiety disorder, unspecified: Secondary | ICD-10-CM | POA: Diagnosis present

## 2022-09-04 DIAGNOSIS — Z79899 Other long term (current) drug therapy: Secondary | ICD-10-CM | POA: Diagnosis not present

## 2022-09-04 DIAGNOSIS — Z8249 Family history of ischemic heart disease and other diseases of the circulatory system: Secondary | ICD-10-CM | POA: Diagnosis not present

## 2022-09-04 DIAGNOSIS — Z1152 Encounter for screening for COVID-19: Secondary | ICD-10-CM | POA: Diagnosis not present

## 2022-09-04 LAB — COMPREHENSIVE METABOLIC PANEL
ALT: 25 U/L (ref 0–44)
AST: 19 U/L (ref 15–41)
Albumin: 3.9 g/dL (ref 3.5–5.0)
Alkaline Phosphatase: 86 U/L (ref 38–126)
Anion gap: 10 (ref 5–15)
BUN: 11 mg/dL (ref 6–20)
CO2: 23 mmol/L (ref 22–32)
Calcium: 9.2 mg/dL (ref 8.9–10.3)
Chloride: 104 mmol/L (ref 98–111)
Creatinine, Ser: 0.84 mg/dL (ref 0.61–1.24)
GFR, Estimated: >60 mL/min (ref >60)
Glucose, Bld: 109 mg/dL — ABNORMAL HIGH (ref 70–99)
Potassium: 3.8 mmol/L (ref 3.5–5.1)
Sodium: 137 mmol/L (ref 135–145)
Total Bilirubin: 0.5 mg/dL (ref 0.3–1.2)
Total Protein: 6.9 g/dL (ref 6.5–8.1)

## 2022-09-04 LAB — CBC
HCT: 41.2 % (ref 39.0–52.0)
Hemoglobin: 14.3 g/dL (ref 13.0–17.0)
MCH: 30.4 pg (ref 26.0–34.0)
MCHC: 34.7 g/dL (ref 30.0–36.0)
MCV: 87.5 fL (ref 80.0–100.0)
Platelets: 198 10*3/uL (ref 150–400)
RBC: 4.71 MIL/uL (ref 4.22–5.81)
RDW: 12.5 % (ref 11.5–15.5)
WBC: 7.7 10*3/uL (ref 4.0–10.5)
nRBC: 0 % (ref 0.0–0.2)

## 2022-09-04 LAB — ECHOCARDIOGRAM COMPLETE
AR max vel: 2.63 cm2
AV Area VTI: 3.28 cm2
AV Area mean vel: 2.68 cm2
AV Mean grad: 2.5 mmHg
AV Peak grad: 4.8 mmHg
Ao pk vel: 1.09 m/s
Area-P 1/2: 3.31 cm2
Calc EF: 58.2 %
S' Lateral: 3 cm
Single Plane A2C EF: 57.4 %
Single Plane A4C EF: 61.6 %

## 2022-09-04 LAB — RESP PANEL BY RT-PCR (RSV, FLU A&B, COVID)  RVPGX2
Influenza A by PCR: NEGATIVE
Influenza B by PCR: NEGATIVE
Resp Syncytial Virus by PCR: NEGATIVE
SARS Coronavirus 2 by RT PCR: NEGATIVE

## 2022-09-04 LAB — CK: Total CK: 51 U/L (ref 49–397)

## 2022-09-04 LAB — PREALBUMIN: Prealbumin: 29 mg/dL (ref 18–38)

## 2022-09-04 LAB — LIPID PANEL
Cholesterol: 179 mg/dL (ref 0–200)
HDL: 36 mg/dL — ABNORMAL LOW (ref >40)
LDL Cholesterol: 106 mg/dL — ABNORMAL HIGH (ref 0–99)
Total CHOL/HDL Ratio: 5 RATIO
Triglycerides: 184 mg/dL — ABNORMAL HIGH (ref <150)
VLDL: 37 mg/dL (ref 0–40)

## 2022-09-04 LAB — C-REACTIVE PROTEIN: CRP: 1.2 mg/dL — ABNORMAL HIGH (ref <1.0)

## 2022-09-04 LAB — PHOSPHORUS
Phosphorus: 4.2 mg/dL (ref 2.5–4.6)
Phosphorus: 4.2 mg/dL (ref 2.5–4.6)

## 2022-09-04 LAB — LACTIC ACID, PLASMA
Lactic Acid, Venous: 1.8 mmol/L (ref 0.5–1.9)
Lactic Acid, Venous: 0.8 mmol/L (ref 0.5–1.9)

## 2022-09-04 LAB — LACTATE DEHYDROGENASE: LDH: 126 U/L (ref 98–192)

## 2022-09-04 LAB — HIV ANTIBODY (ROUTINE TESTING W REFLEX): HIV Screen 4th Generation wRfx: NONREACTIVE

## 2022-09-04 LAB — MAGNESIUM
Magnesium: 2 mg/dL (ref 1.7–2.4)
Magnesium: 2.1 mg/dL (ref 1.7–2.4)

## 2022-09-04 LAB — PROTIME-INR
INR: 1 (ref 0.8–1.2)
Prothrombin Time: 12.7 seconds (ref 11.4–15.2)

## 2022-09-04 LAB — D-DIMER, QUANTITATIVE: D-Dimer, Quant: 0.39 ug/mL-FEU (ref 0.00–0.50)

## 2022-09-04 LAB — SEDIMENTATION RATE: Sed Rate: 17 mm/hr — ABNORMAL HIGH (ref 0–16)

## 2022-09-04 LAB — TSH: TSH: 3.707 u[IU]/mL (ref 0.350–4.500)

## 2022-09-04 MED ORDER — ONDANSETRON HCL 4 MG PO TABS: 4.0000 mg | ORAL_TABLET | Freq: Four times a day (QID) | ORAL | Status: DC | PRN

## 2022-09-04 MED ORDER — SODIUM CHLORIDE 0.9 % IV SOLN: INTRAVENOUS | Status: DC

## 2022-09-04 MED ORDER — ACETAMINOPHEN 325 MG PO TABS
650.0000 mg | ORAL_TABLET | Freq: Four times a day (QID) | ORAL | Status: DC | PRN
  Administered 2022-09-05: 650 mg via ORAL
  Filled 2022-09-04 (×2): qty 2

## 2022-09-04 MED ORDER — PANTOPRAZOLE SODIUM 40 MG PO TBEC
40.0000 mg | DELAYED_RELEASE_TABLET | Freq: Every day | ORAL | Status: DC
  Administered 2022-09-04: 40 mg via ORAL
  Filled 2022-09-04 (×2): qty 1

## 2022-09-04 MED ORDER — ACETAMINOPHEN 650 MG RE SUPP: 650.0000 mg | Freq: Four times a day (QID) | RECTAL | Status: DC | PRN

## 2022-09-04 MED ORDER — MORPHINE SULFATE (PF) 2 MG/ML IV SOLN: 2.0000 mg | INTRAVENOUS | Status: DC | PRN

## 2022-09-04 MED ORDER — FLUTICASONE PROPIONATE 50 MCG/ACT NA SUSP
2.0000 | Freq: Every day | NASAL | Status: DC
  Administered 2022-09-04: 2 via NASAL
  Filled 2022-09-04 (×2): qty 16

## 2022-09-04 MED ORDER — ONDANSETRON HCL 4 MG/2ML IJ SOLN: 4.0000 mg | Freq: Four times a day (QID) | INTRAMUSCULAR | Status: DC | PRN

## 2022-09-04 MED ORDER — NITROGLYCERIN 0.4 MG SL SUBL
0.4000 mg | SUBLINGUAL_TABLET | SUBLINGUAL | Status: DC | PRN
  Administered 2022-09-04: 0.4 mg via SUBLINGUAL
  Filled 2022-09-04: qty 1

## 2022-09-04 MED ORDER — MIDAZOLAM HCL 2 MG/2ML IJ SOLN: 2.0000 mg | INTRAMUSCULAR | Status: DC | PRN

## 2022-09-04 MED ORDER — AYR SALINE NASAL NA GEL
1.0000 | NASAL | Status: DC | PRN
  Administered 2022-09-04: 1 via NASAL
  Filled 2022-09-04 (×2): qty 14.1

## 2022-09-04 NOTE — Progress Notes (Signed)
  Echocardiogram 2D Echocardiogram has been performed.  [name] [name] 09/04/2022, 10:25 AM

## 2022-09-04 NOTE — Progress Notes (Cosign Needed)
  Transition of Care [facility]) Screening Note   Patient Details  Name: [name] [name] Date of Birth: 1976-05-08   Transition of Care [facility]) CM/SW Contact:    Pollie Friar, RN Phone Number: 09/04/2022, 3:29 PM   Pt is from home with spouse. Pt is on LTEEG.  Transition of Care Department [facility]) has reviewed patient. We will continue to monitor patient advancement through interdisciplinary progression rounds. If new patient transition needs arise, please place a TOC consult.
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
## 2022-09-04 NOTE — Progress Notes (Signed)
LTM EEG hooked up and running - no initial skin breakdown - push button tested - [facility] monitoring.  

## 2022-09-04 NOTE — Progress Notes (Signed)
[facility] PROGRESS NOTE   [name] [name] [id number] DOB: Aug 17, 1975 DOA: 09/03/2022  PCP: [name], MD  Brief History/Interval Summary: 47 year old Caucasian male with a past medical history of seizure disorder, hypercholesterolemia and essential hypertension for which he is not on any medications who came in with episodes concerning for seizure type activity.  There was also loss of consciousness.  He was hospitalized for further management.  Consultants: Neurology  Procedures: Continuous EEG has been ordered.  Echocardiogram is pending.    Subjective/Interval History: Patient denies any headache shortness of breath or chest pain.  No nausea or vomiting.    Assessment/Plan:  Syncopal episode/seizure type activity/history of seizure disorder CT head did not show any acute findings.  Patient seems to be neurologically intact this morning. Patient to be placed on continuous EEG. Management per neurology.  Remains on Keppra 1000 mg twice a day. Echocardiogram is pending.  May need 30-day event monitor if no neurological etiology for syncope is found.  Chest x-ray without any abnormal findings.  EKG was also unremarkable with normal intervals.  Continue telemetry for now.  No arrhythmias noted as yet.  Weight loss Outpatient management.  He has been under a lot of stress recently which could be contributing.  Blood work reviewed.  TSH noted to be normal.  GERD Continue PPI  Obesity Estimated body mass index is 30.11 kg/m as calculated from the following:   Height as of 09/01/22: '5\' 8"'$  (1.727 m).   Weight as of 09/01/22: 89.8 kg.   DVT Prophylaxis: SCDs Code Status: Full code Family Communication: Discussed with patient Disposition Plan: Hopefully return home when improved  Status is: Observation The patient will require care spanning > 2 midnights and should be moved to inpatient because: Continuous EEG    Medications: Scheduled:  fluticasone   2 spray Each Nare Daily   levETIRAcetam  1,000 mg Oral BID   pantoprazole  40 mg Oral Daily   Continuous:  sodium chloride     [id number] **OR** acetaminophen, ondansetron **OR** ondansetron (ZOFRAN) IV, saline, zolpidem  Antibiotics: Anti-infectives (From admission, onward)    None       Objective:  Vital Signs  Vitals:   09/03/22 2315 09/04/22 0056 09/04/22 0345 09/04/22 0724  BP: 127/82 (!) 140/89 130/85 135/84  Pulse: 79 66 83 69  Resp: '17 18 16 16  '$ Temp:  97.6 F (36.4 C) 98.3 F (36.8 C) 97.8 F (36.6 C)  TempSrc:  Oral Oral Oral  SpO2: 96% 99% 99% 98%    Intake/Output Summary (Last 24 hours) at 09/04/2022 0906 Last data filed at 09/04/2022 0616 Gross per 24 hour  Intake 360 ml  Output 400 ml  Net -40 ml   There were no vitals filed for this visit.  General appearance: Awake alert.  In no distress Resp: Clear to auscultation bilaterally.  Normal effort Cardio: S1-S2 is normal regular.  No S3-S4.  No rubs murmurs or bruit GI: Abdomen is soft.  Nontender nondistended.  Bowel sounds are present normal.  No masses organomegaly Extremities: No edema.  Full range of motion of lower extremities. Neurologic: Alert and oriented x3.  No focal neurological deficits.    Lab Results:  Data Reviewed: I have personally reviewed following labs and reports of the imaging studies  CBC: Recent Labs  Lab 09/03/22 1949 09/04/22 0435  WBC 8.2 7.7  NEUTROABS 5.0  --   HGB 15.0 14.3  HCT 42.6 41.2  MCV 87.1 87.5  PLT 220 198  Basic Metabolic Panel: Recent Labs  Lab 09/03/22 1949 09/04/22 0105 09/04/22 0435  NA 136  --  137  K 3.9  --  3.8  CL 104  --  104  CO2 21*  --  23  GLUCOSE 111*  --  109*  BUN 13  --  11  CREATININE 0.95  --  0.84  CALCIUM 9.8  --  9.2  MG  --  2.0 2.1  PHOS  --  4.2 4.2    GFR: Estimated Creatinine Clearance: 119.7 mL/min (by C-G formula based on SCr of 0.84 mg/dL).  Liver Function Tests: Recent Labs  Lab  09/03/22 1949 09/04/22 0435  AST 26 19  ALT 28 25  ALKPHOS 84 86  BILITOT 0.4 0.5  PROT 7.1 6.9  ALBUMIN 4.2 3.9     Recent Labs  Lab 09/03/22 2014  AMMONIA 24    Coagulation Profile: Recent Labs  Lab 09/04/22 0105  INR 1.0    Cardiac Enzymes: Recent Labs  Lab 09/04/22 0105  CKTOTAL 51     CBG: Recent Labs  Lab 09/03/22 2019  GLUCAP 111*    Lipid Profile: Recent Labs    09/04/22 0105  CHOL 179  HDL 36*  LDLCALC 106*  TRIG 184*  CHOLHDL 5.0    Thyroid Function Tests: Recent Labs    09/04/22 0435  TSH 3.707    Radiology Studies: DG CHEST PORT 1 VIEW  Result Date: 09/03/2022 CLINICAL DATA:  Syncope EXAM: PORTABLE CHEST 1 VIEW COMPARISON:  None Available. FINDINGS: Lungs are clear.  No pleural effusion or pneumothorax. The heart is normal in size. IMPRESSION: No evidence of acute cardiopulmonary disease. Electronically Signed   By: Julian Hy M.D.   On: 09/03/2022 23:18   CT Head Wo Contrast  Result Date: 09/03/2022 CLINICAL DATA:  Seizure. EXAM: CT HEAD WITHOUT CONTRAST TECHNIQUE: Contiguous axial images were obtained from the base of the skull through the vertex without intravenous contrast. RADIATION DOSE REDUCTION: This exam was performed according to the departmental dose-optimization program which includes automated exposure control, adjustment of the mA and/or kV according to patient size and/or use of iterative reconstruction technique. COMPARISON:  None Available. FINDINGS: Brain: The ventricles and sulci are appropriate size for the patient's age. The gray-white matter discrimination is preserved. There is no acute intracranial hemorrhage. No mass effect or midline shift. No extra-axial fluid collection. Vascular: No hyperdense vessel or unexpected calcification. Skull: Normal. Negative for fracture or focal lesion. Sinuses/Orbits: Diffuse mucoperiosteal thickening of paranasal sinuses. No air-fluid level. The mastoid air cells are clear.  Other: None IMPRESSION: 1. No acute intracranial pathology. 2. Paranasal sinus disease. Electronically Signed   By: Anner Crete M.D.   On: 09/03/2022 20:40       LOS: 0 days   [place] [facility] Pager on [email or web address]  09/04/2022, 9:06 AM

## 2022-09-04 NOTE — Progress Notes (Signed)
Patient refuse IV Fluid, stated he will drink water MD on call notified.

## 2022-09-04 NOTE — Plan of Care (Signed)

## 2022-09-04 NOTE — Telephone Encounter (Signed)
Called patient and LVM stating to return a call here at the office

## 2022-09-04 NOTE — Progress Notes (Signed)
Activations complete on pt. Pt has call button

## 2022-09-04 NOTE — Plan of Care (Signed)
Able verbalize needs, effective teach back

## 2022-09-04 NOTE — Progress Notes (Unsigned)
Enrolled for [employer] to mail a ZIO XT long term holter monitor to the patients address on file.   DOD to read. 

## 2022-09-05 DIAGNOSIS — G40909 Epilepsy, unspecified, not intractable, without status epilepticus: Secondary | ICD-10-CM | POA: Diagnosis not present

## 2022-09-05 DIAGNOSIS — R55 Syncope and collapse: Secondary | ICD-10-CM | POA: Diagnosis not present

## 2022-09-05 DIAGNOSIS — R079 Chest pain, unspecified: Secondary | ICD-10-CM | POA: Diagnosis not present

## 2022-09-05 DIAGNOSIS — R569 Unspecified convulsions: Secondary | ICD-10-CM

## 2022-09-05 LAB — TROPONIN I (HIGH SENSITIVITY)
Troponin I (High Sensitivity): 2 ng/L (ref <18)
Troponin I (High Sensitivity): <2 ng/L (ref <18)

## 2022-09-05 LAB — ANA W/REFLEX IF POSITIVE: Anti Nuclear Antibody (ANA): NEGATIVE

## 2022-09-05 LAB — LEVETIRACETAM LEVEL: Levetiracetam Lvl: 17.8 ug/mL (ref 10.0–40.0)

## 2022-09-05 MED ORDER — PANTOPRAZOLE SODIUM 40 MG PO TBEC
40.0000 mg | DELAYED_RELEASE_TABLET | Freq: Two times a day (BID) | ORAL | Status: DC
  Administered 2022-09-05 – 2022-09-07 (×5): 40 mg via ORAL
  Filled 2022-09-05 (×4): qty 1

## 2022-09-05 MED ORDER — ALPRAZOLAM 0.25 MG PO TABS
0.2500 mg | ORAL_TABLET | Freq: Once | ORAL | Status: DC
  Filled 2022-09-05: qty 1

## 2022-09-05 NOTE — Progress Notes (Signed)
Subjective: Had 1 episode of lipsmacking and feeling woozy yesterday without concomitant EEG change.  States this was 2/10 compared to his typical episodes.  ROS: negative except above  Examination  Vital signs in last 24 hours: Temp:  [98.1 F (36.7 C)-98.7 F (37.1 C)] 98.2 F (36.8 C) (03/05 0713) Pulse Rate:  [70-91] 70 (03/05 0713) Resp:  [16-18] 18 (03/05 0713) BP: (111-130)/(80-84) 130/81 (03/05 0713) SpO2:  [98 %-99 %] 98 % (03/05 0713)  General: lying in bed, NAD Neuro: MS: Alert, oriented, follows commands CN: pupils equal and reactive,  EOMI, face symmetric, tongue midline, normal sensation over face, Motor: 5/5 strength in all 4 extremities Coordination: normal Gait: not tested  Basic Metabolic Panel: Recent Labs  Lab 09/03/22 1949 09/04/22 0105 09/04/22 0435  NA 136  --  137  K 3.9  --  3.8  CL 104  --  104  CO2 21*  --  23  GLUCOSE 111*  --  109*  BUN 13  --  11  CREATININE 0.95  --  0.84  CALCIUM 9.8  --  9.2  MG  --  2.0 2.1  PHOS  --  4.2 4.2    CBC: Recent Labs  Lab 09/03/22 1949 09/04/22 0435  WBC 8.2 7.7  NEUTROABS 5.0  --   HGB 15.0 14.3  HCT 42.6 41.2  MCV 87.1 87.5  PLT 220 198     Coagulation Studies: Recent Labs    09/04/22 0105  LABPROT 12.7  INR 1.0    Imaging No new brain imaging overnight   ASSESSMENT AND PLAN: 47 year old male with new onset seizure-like episodes.   Seizure-like episodes -Continue video EEG for characterization of spells -Patient reports stability on Keppra.  Is currently held for seizure provocation.  However even if EEG shows abnormality, would recommend starting lamotrigine or oxcarbazepine for mood stabilization -Recommend sleep deprivation today -Continue seizure precautions -As needed IV versed 2 mg for clinical seizure-like episodes   I have spent a total of   38 minutes with the patient reviewing hospital notes,  test results, labs and examining the patient as well as establishing an  assessment and plan that was discussed personally with the patient and wife at bedside.  > 50% of time was spent in direct patient care.    Zeb Comfort Epilepsy [facility] For questions after 5pm please refer to AMION to reach the Neurologist on call

## 2022-09-05 NOTE — Telephone Encounter (Signed)
Pt was seen by PCP 09/01/2022 at 4pm and is now admitted in the hospital for Seizure like activity.

## 2022-09-05 NOTE — Procedures (Addendum)
Patient Name: [name] [name]  MRN: [id number]  Epilepsy Attending: Lora Havens  Referring Physician/Provider: Donnetta Simpers, MD  Duration: 09/04/2022 0827 to 09/05/2022 0827  Patient history: 47 year old male with new onset seizure-like episodes. EEG to evaluate for seizure  Level of alertness: Awake, asleep  AEDs during EEG study: None  Technical aspects: This EEG study was done with scalp electrodes positioned according to the 10-20 International system of electrode placement. Electrical activity was reviewed with band pass filter of 1-'70Hz'$ , sensitivity of 7 uV/mm, display speed of 30m/sec with a '60Hz'$  notched filter applied as appropriate. EEG data were recorded continuously and digitally stored.  Video monitoring was available and reviewed as appropriate.  Description: The posterior dominant rhythm consists of 10 Hz activity of moderate voltage (25-35 uV) seen predominantly in posterior head regions, symmetric and reactive to eye opening and eye closing. Sleep was characterized by vertex waves, sleep spindles (12 to 14 Hz), maximal frontocentral region, posterior occipital sharp transients of sleep.     Event button was pressed on 09/04/2022 at [place] Patient states he is feeling tired. Concomitant EEG before, during and after the event showed normal posterior dominant rhythm.  Hyperventilation did not show any EEG change.  Physiologic photic driving was seen during photic stimulation.    IMPRESSION: This study is within normal limits. No seizures or epileptiform discharges were seen throughout the recording.  Event button was pressed on 09/04/2022 at [place] Patient states he is feeling tired. Concomitant EEG showed normal posterior dominant rhythm. This was most likely NOT an epileptic event.   Odile Veloso OBarbra Sarks

## 2022-09-05 NOTE — Plan of Care (Signed)

## 2022-09-05 NOTE — Progress Notes (Signed)
[facility] PROGRESS NOTE   [name] [name] [id number] DOB: [birth year] DOA: 09/03/2022  PCP: [name], MD  Brief History/Interval Summary: 47 year old Caucasian male with a past medical history of seizure disorder, hypercholesterolemia and essential hypertension for which he is not on any medications who came in with episodes concerning for seizure type activity.  There was also loss of consciousness.  He was hospitalized for further management.  Consultants: Neurology  Procedures: Continuous EEG.  Echocardiogram.    Subjective/Interval History: Patient had a 10-minute episode of chest pain overnight.  He was given nitroglycerin with resolution of his symptoms.  Denies any chest pain or shortness of breath this morning.  He was lying down when he had this episode.  He does get acid reflux at times but not last night.    Assessment/Plan:  Syncopal episode/seizure type activity/history of seizure disorder CT head did not show any acute findings.  Seen by neurology.  Currently on continuous EEG.  Keppra currently on hold.  Chest pain Experienced chest pain overnight.  EKG similar to previous EKGs and without any acute findings.  Troponin levels were normal.  Echocardiogram shows normal systolic function without any significant valvular abnormalities.  No pericardial effusion. No arrhythmias noted on telemetry.  He was noted to have an episode of sinus tachycardia during the time when he had his chest pain.  Chest x-ray without any abnormal findings. Continue to monitor for now.  If he does not have recurrence of the symptoms he can be seen by Dr. Gwenlyn Found (patient preference) in the outpatient setting.  Symptoms are atypical to begin with. LDL noted to be 106.  Weight loss Outpatient management.  He has been under a lot of stress recently which could be contributing.  Blood work reviewed.  TSH noted to be normal.  GERD Continue PPI  Obesity Estimated body  mass index is 30.1 kg/m as calculated from the following:   Height as of this encounter: '5\' 8"'$  (1.727 m).   Weight as of this encounter: 89.8 kg.   DVT Prophylaxis: SCDs Code Status: Full code Family Communication: Discussed with patient Disposition Plan: Hopefully return home when improved  Status is: Inpatient Remains inpatient appropriate because: Concern for seizure disorder     Medications: Scheduled:  fluticasone  2 spray Each Nare Daily   pantoprazole  40 mg Oral Daily   Continuous:   [id number] **OR** acetaminophen, midazolam, morphine injection, nitroGLYCERIN, ondansetron **OR** ondansetron (ZOFRAN) IV, saline, zolpidem  Antibiotics: Anti-infectives (From admission, onward)    None       Objective:  Vital Signs  Vitals:   09/04/22 2006 09/04/22 2236 09/05/22 0323 09/05/22 0713  BP: 114/81 116/82 111/84 130/81  Pulse: 84 74 70 70  Resp: '17 16 18 18  '$ Temp: 98.7 F (37.1 C) 98.5 F (36.9 C) 98.1 F (36.7 C) 98.2 F (36.8 C)  TempSrc: Oral Oral Oral Oral  SpO2: 99%  98% 98%  Weight:      Height:       No intake or output data in the 24 hours ending 09/05/22 0936  Filed Weights   09/04/22 1100  Weight: 89.8 kg    General appearance: Awake alert.  In no distress.  Noted to be anxious. Resp: Clear to auscultation bilaterally.  Normal effort Cardio: S1-S2 is normal regular.  No S3-S4.  No rubs murmurs or bruit GI: Abdomen is soft.  Nontender nondistended.  Bowel sounds are present normal.  No masses organomegaly Extremities: No edema.  Full  range of motion of lower extremities. Neurologic: Alert and oriented x3.  No focal neurological deficits.     Lab Results:  Data Reviewed: I have personally reviewed following labs and reports of the imaging studies  CBC: Recent Labs  Lab 09/03/22 1949 09/04/22 0435  WBC 8.2 7.7  NEUTROABS 5.0  --   HGB 15.0 14.3  HCT 42.6 41.2  MCV 87.1 87.5  PLT 220 198     Basic Metabolic  Panel: Recent Labs  Lab 09/03/22 1949 09/04/22 0105 09/04/22 0435  NA 136  --  137  K 3.9  --  3.8  CL 104  --  104  CO2 21*  --  23  GLUCOSE 111*  --  109*  BUN 13  --  11  CREATININE 0.95  --  0.84  CALCIUM 9.8  --  9.2  MG  --  2.0 2.1  PHOS  --  4.2 4.2     GFR: Estimated Creatinine Clearance: 119.7 mL/min (by C-G formula based on SCr of 0.84 mg/dL).  Liver Function Tests: Recent Labs  Lab 09/03/22 1949 09/04/22 0435  AST 26 19  ALT 28 25  ALKPHOS 84 86  BILITOT 0.4 0.5  PROT 7.1 6.9  ALBUMIN 4.2 3.9      Recent Labs  Lab 09/03/22 2014  AMMONIA 24     Coagulation Profile: Recent Labs  Lab 09/04/22 0105  INR 1.0     Cardiac Enzymes: Recent Labs  Lab 09/04/22 0105  CKTOTAL 51      CBG: Recent Labs  Lab 09/03/22 2019  GLUCAP 111*     Lipid Profile: Recent Labs    09/04/22 0105  CHOL 179  HDL 36*  LDLCALC 106*  TRIG 184*  CHOLHDL 5.0     Thyroid Function Tests: Recent Labs    09/04/22 0435  TSH 3.707     Radiology Studies: Overnight EEG with video  Result Date: 09/05/2022 Lora Havens, MD     09/05/2022  8:59 AM Patient Name: [name] [name] MRN: [id number] Epilepsy Attending: Lora Havens Referring Physician/Provider: Donnetta Simpers, MD Duration: 09/04/2022 0827 to 09/05/2022 0827 Patient history: 47 year old male with new onset seizure-like episodes. EEG to evaluate for seizure Level of alertness: Awake, asleep AEDs during EEG study: None Technical aspects: This EEG study was done with scalp electrodes positioned according to the 10-20 International system of electrode placement. Electrical activity was reviewed with band pass filter of 1-'70Hz'$ , sensitivity of 7 uV/mm, display speed of 63m/sec with a '60Hz'$  notched filter applied as appropriate. EEG data were recorded continuously and digitally stored.  Video monitoring was available and reviewed as appropriate. Description: The posterior dominant rhythm consists of 10  Hz activity of moderate voltage (25-35 uV) seen predominantly in posterior head regions, symmetric and reactive to eye opening and eye closing. Sleep was characterized by vertex waves, sleep spindles (12 to 14 Hz), maximal frontocentral region, posterior occipital sharp transients of sleep.   Event button was pressed on 09/04/2022 at 1[place] Patient states he is feeling tired. Concomitant EEG before, during and after the event showed normal posterior dominant rhythm. Hyperventilation did not show any EEG change.  Physiologic photic driving was seen during photic stimulation.  IMPRESSION: This study is within normal limits. No seizures or epileptiform discharges were seen throughout the recording. Event button was pressed on 09/04/2022 at 1[place] Patient states he is feeling tired. Concomitant EEG showed normal posterior dominant rhythm. This was most likely NOT an epileptic event. Priyanka  Barbra Sarks   ECHOCARDIOGRAM COMPLETE  Result Date: 09/04/2022    ECHOCARDIOGRAM REPORT   Patient Name:   [name] [name] Date of Exam: 09/04/2022 Medical Rec #:  [id number]         Height:       68.0 in Accession #:    [id number]        Weight:       198.0 lb Date of Birth:  06-03-1976         BSA:          2.035 m Patient Age:    66 years          BP:           135/84 mmHg Patient Gender: M                 HR:           80 bpm. Exam Location:  Inpatient Procedure: 2D Echo Indications:    syncope  History:        Patient has no prior history of Echocardiogram examinations.                 Risk Factors:Current Smoker.  Sonographer:    Harvie Junior Referring Phys: Toy Baker  Sonographer Comments: Technically difficult study due to poor echo windows. IMPRESSIONS  1. Poor acoustic windows limit study. . Left ventricular ejection fraction, by estimation, is 55 to 60%. The left ventricle has normal function. Left ventricular diastolic parameters were normal.  2. Right ventricular systolic function is normal. The right ventricular size  is normal.  3. Trivial mitral valve regurgitation.  4. The aortic valve is normal in structure. Aortic valve regurgitation is not visualized.  5. The inferior vena cava is normal in size with greater than 50% respiratory variability, suggesting right atrial pressure of 3 mmHg. FINDINGS  Left Ventricle: Poor acoustic windows limit study. Left ventricular ejection fraction, by estimation, is 55 to 60%. The left ventricle has normal function. The left ventricular internal cavity size was normal in size. There is no left ventricular hypertrophy. Left ventricular diastolic parameters were normal. Right Ventricle: The right ventricular size is normal. Right vetricular wall thickness was not assessed. Right ventricular systolic function is normal. Left Atrium: Left atrial size was normal in size. Right Atrium: Right atrial size was normal in size. Pericardium: There is no evidence of pericardial effusion. Mitral Valve: There is mild thickening of the mitral valve leaflet(s). There is mild calcification of the mitral valve leaflet(s). Trivial mitral valve regurgitation. Tricuspid Valve: The tricuspid valve is grossly normal. Tricuspid valve regurgitation is not demonstrated. Aortic Valve: The aortic valve is normal in structure. Aortic valve regurgitation is not visualized. Aortic valve mean gradient measures 2.5 mmHg. Aortic valve peak gradient measures 4.8 mmHg. Aortic valve area, by VTI measures 3.28 cm. Pulmonic Valve: The pulmonic valve was grossly normal. Pulmonic valve regurgitation is not visualized. Aorta: The aortic root and ascending aorta are structurally normal, with no evidence of dilitation. Venous: The inferior vena cava is normal in size with greater than 50% respiratory variability, suggesting right atrial pressure of 3 mmHg. IAS/Shunts: No atrial level shunt detected by color flow Doppler.  LEFT VENTRICLE PLAX 2D LVIDd:         4.60 cm      Diastology LVIDs:         3.00 cm      LV e' medial:    6.42  cm/s LV PW:  0.90 cm      LV E/e' medial:  8.1 LV IVS:        0.90 cm      LV e' lateral:   13.10 cm/s LVOT diam:     2.00 cm      LV E/e' lateral: 4.0 LV SV:         51 LV SV Index:   25 LVOT Area:     3.14 cm  LV Volumes (MOD) LV vol d, MOD A2C: 61.0 ml LV vol d, MOD A4C: 103.0 ml LV vol s, MOD A2C: 26.0 ml LV vol s, MOD A4C: 39.6 ml LV SV MOD A2C:     35.0 ml LV SV MOD A4C:     103.0 ml LV SV MOD BP:      46.3 ml RIGHT VENTRICLE RV Basal diam:  2.80 cm RV Mid diam:    2.50 cm RV S prime:     20.10 cm/s TAPSE (M-mode): 2.6 cm LEFT ATRIUM             Index        RIGHT ATRIUM          Index LA diam:        3.00 cm 1.47 cm/m   RA Area:     8.01 cm LA Vol (A2C):   30.4 ml 14.94 ml/m  RA Volume:   12.20 ml 6.00 ml/m LA Vol (A4C):   32.1 ml 15.77 ml/m LA Biplane Vol: 31.4 ml 15.43 ml/m  AORTIC VALVE                    PULMONIC VALVE AV Area (Vmax):    2.63 cm     PV Vmax:       1.02 m/s AV Area (Vmean):   2.68 cm     PV Peak grad:  4.2 mmHg AV Area (VTI):     3.28 cm AV Vmax:           109.00 cm/s AV Vmean:          71.050 cm/s AV VTI:            0.154 m AV Peak Grad:      4.8 mmHg AV Mean Grad:      2.5 mmHg LVOT Vmax:         91.20 cm/s LVOT Vmean:        60.700 cm/s LVOT VTI:          0.161 m LVOT/AV VTI ratio: 1.05  AORTA Ao Root diam: 3.50 cm Ao Asc diam:  3.70 cm MITRAL VALVE MV Area (PHT): 3.31 cm    SHUNTS MV Decel Time: 229 msec    Systemic VTI:  0.16 m MV E velocity: 52.20 cm/s  Systemic Diam: 2.00 cm MV A velocity: 63.20 cm/s MV E/A ratio:  0.83 Dorris Carnes MD Electronically signed by Dorris Carnes MD Signature Date/Time: 09/04/2022/12:28:04 PM    Final    DG CHEST PORT 1 VIEW  Result Date: 09/03/2022 CLINICAL DATA:  Syncope EXAM: PORTABLE CHEST 1 VIEW COMPARISON:  None Available. FINDINGS: Lungs are clear.  No pleural effusion or pneumothorax. The heart is normal in size. IMPRESSION: No evidence of acute cardiopulmonary disease. Electronically Signed   By: Julian Hy M.D.   On:  09/03/2022 23:18   CT Head Wo Contrast  Result Date: 09/03/2022 CLINICAL DATA:  Seizure. EXAM: CT HEAD WITHOUT CONTRAST TECHNIQUE: Contiguous axial images were obtained from the base of  the skull through the vertex without intravenous contrast. RADIATION DOSE REDUCTION: This exam was performed according to the departmental dose-optimization program which includes automated exposure control, adjustment of the mA and/or kV according to patient size and/or use of iterative reconstruction technique. COMPARISON:  None Available. FINDINGS: Brain: The ventricles and sulci are appropriate size for the patient's age. The gray-white matter discrimination is preserved. There is no acute intracranial hemorrhage. No mass effect or midline shift. No extra-axial fluid collection. Vascular: No hyperdense vessel or unexpected calcification. Skull: Normal. Negative for fracture or focal lesion. Sinuses/Orbits: Diffuse mucoperiosteal thickening of paranasal sinuses. No air-fluid level. The mastoid air cells are clear. Other: None IMPRESSION: 1. No acute intracranial pathology. 2. Paranasal sinus disease. Electronically Signed   By: Anner Crete M.D.   On: 09/03/2022 20:40       LOS: 1 day   [facility] Pager on [email or web address]  09/05/2022, 9:36 AM

## 2022-09-06 ENCOUNTER — Ambulatory Visit: Payer: BC Managed Care – PPO | Admitting: Internal Medicine

## 2022-09-06 DIAGNOSIS — R569 Unspecified convulsions: Secondary | ICD-10-CM | POA: Diagnosis not present

## 2022-09-06 DIAGNOSIS — G40909 Epilepsy, unspecified, not intractable, without status epilepticus: Secondary | ICD-10-CM | POA: Diagnosis not present

## 2022-09-06 MED ORDER — HYDROXYZINE HCL 25 MG PO TABS: 50.0000 mg | ORAL_TABLET | Freq: Three times a day (TID) | ORAL | Status: DC | PRN

## 2022-09-06 MED ORDER — LORAZEPAM 0.5 MG PO TABS
0.5000 mg | ORAL_TABLET | Freq: Four times a day (QID) | ORAL | Status: DC | PRN
  Administered 2022-09-06: 0.5 mg via ORAL
  Filled 2022-09-06: qty 1

## 2022-09-06 MED ORDER — HYDROXYZINE HCL 25 MG PO TABS
25.0000 mg | ORAL_TABLET | Freq: Three times a day (TID) | ORAL | Status: DC | PRN
  Administered 2022-09-06: 25 mg via ORAL
  Filled 2022-09-06: qty 1

## 2022-09-06 NOTE — Progress Notes (Signed)
Subjective:  Had 1 episode of feeling light headed yesterday without concomitant EEG change.   ROS: negative except above  Examination  Vital signs in last 24 hours: Temp:  [98 F (36.7 C)-98.6 F (37 C)] 98.4 F (36.9 C) (03/06 0733) Pulse Rate:  [66-83] 73 (03/06 1200) Resp:  [17-20] 18 (03/06 1200) BP: (114-137)/(72-93) 127/83 (03/06 1200) SpO2:  [97 %-99 %] 99 % (03/06 1200)  General: lying in bed, NAD Neuro: MS: Alert, oriented, follows commands CN: pupils equal and reactive,  EOMI, face symmetric, tongue midline, normal sensation over face, Motor: 5/5 strength in all 4 extremities Coordination: normal Gait: not tested  Basic Metabolic Panel: Recent Labs  Lab 09/03/22 1949 09/04/22 0105 09/04/22 0435  NA 136  --  137  K 3.9  --  3.8  CL 104  --  104  CO2 21*  --  23  GLUCOSE 111*  --  109*  BUN 13  --  11  CREATININE 0.95  --  0.84  CALCIUM 9.8  --  9.2  MG  --  2.0 2.1  PHOS  --  4.2 4.2    CBC: Recent Labs  Lab 09/03/22 1949 09/04/22 0435  WBC 8.2 7.7  NEUTROABS 5.0  --   HGB 15.0 14.3  HCT 42.6 41.2  MCV 87.1 87.5  PLT 220 198     Coagulation Studies: Recent Labs    09/04/22 0105  LABPROT 12.7  INR 1.0    Imaging No new brain imaging overnight     ASSESSMENT AND PLAN: 47 year old male with new onset seizure-like episodes.   Seizure-like episodes -Continue video EEG for characterization of spells -Patient reports stability on Keppra. Is currently held for seizure provocation.  However even if EEG shows abnormality, would recommend starting lamotrigine or oxcarbazepine for mood stabilization -Has had MRI brain wo contrast recently at outside facility ( not available for review) but per patient and wife was normal.  -Continue seizure precautions -As needed IV versed 2 mg for clinical seizure-like episodes   I have spent a total of  26 minutes with the patient reviewing hospital notes,  test results, labs and examining the patient as  well as establishing an assessment and plan that was discussed personally with the patient and wife on phone.  > 50% of time was spent in direct patient care.   Zeb Comfort Epilepsy [facility] For questions after 5pm please refer to AMION to reach the Neurologist on call

## 2022-09-06 NOTE — Procedures (Signed)
Patient Name: Aryo Asman  MRN: [id number]  Epilepsy Attending: Lora Havens  Referring Physician/Provider: Donnetta Simpers, MD  Duration: 09/05/2022 0827 to 09/06/2022 0827   Patient history: 47 year old male with new onset seizure-like episodes. EEG to evaluate for seizure   Level of alertness: Awake, asleep   AEDs during EEG study: None   Technical aspects: This EEG study was done with scalp electrodes positioned according to the 10-20 International system of electrode placement. Electrical activity was reviewed with band pass filter of 1-'70Hz'$ , sensitivity of 7 uV/mm, display speed of 38m/sec with a '60Hz'$  notched filter applied as appropriate. EEG data were recorded continuously and digitally stored.  Video monitoring was available and reviewed as appropriate.   Description: The posterior dominant rhythm consists of 10 Hz activity of moderate voltage (25-35 uV) seen predominantly in posterior head regions, symmetric and reactive to eye opening and eye closing. Sleep was characterized by vertex waves, sleep spindles (12 to 14 Hz), maximal frontocentral region, posterior occipital sharp transients of sleep.      Event button was pressed on 09/05/2022 at 2224. Patient reported feeling lightheaded. Concomitant EEG before, during and after the event showed normal posterior dominant rhythm.   IMPRESSION: This study is within normal limits. No seizures or epileptiform discharges were seen throughout the recording.   Event button was pressed on 09/05/2022 at 2224. Patient reported feeling lightheaded. Concomitant EEG showed normal posterior dominant rhythm. This was most likely NOT an epileptic event.    Davina Howlett OBarbra Sarks

## 2022-09-06 NOTE — Telephone Encounter (Signed)
Error

## 2022-09-06 NOTE — Progress Notes (Signed)
Patient was anxious, so PRN atarax given at 1151, but again patient was restless and was more anxious, so informed to Dr. Kreg Shropshire ativan 0.'5mg'$  Po given as Dr's instruction. At 1432

## 2022-09-06 NOTE — Progress Notes (Signed)
[facility] PROGRESS NOTE   [name] [name] [id number] DOB: 1975/11/21 DOA: 09/03/2022  PCP: [name], MD  Brief History/Interval Summary: 47 year old Caucasian male with a past medical history of seizure disorder, hypercholesterolemia and essential hypertension for which he is not on any medications who came in with episodes concerning for seizure type activity.  There was also loss of consciousness.  He was hospitalized for further management.  Consultants: Neurology  Procedures: Continuous EEG.  Echocardiogram.    Subjective/Interval History: Reports anxiety.  No nausea or vomiting, no fever no chills reported  Assessment/Plan:  Syncopal episode/seizure type activity/history of seizure disorder CT head did not show any acute findings.  Seen by neurology.  Currently on continuous EEG.  Keppra currently on hold.  Chest pain Experienced chest pain overnight.  EKG similar to previous EKGs and without any acute findings.  Troponin levels were normal.  Echocardiogram shows normal systolic function without any significant valvular abnormalities.  No pericardial effusion. No arrhythmias noted on telemetry.  He was noted to have an episode of sinus tachycardia during the time when he had his chest pain.  Chest x-ray without any abnormal findings. Continue to monitor for now.  If he does not have recurrence of the symptoms he can be seen by Dr. Gwenlyn Found (patient preference) in the outpatient setting.  Symptoms are atypical to begin with. LDL noted to be 106.  Weight loss Outpatient management.  He has been under a lot of stress recently which could be contributing.  Blood work reviewed.  TSH noted to be normal.  GERD Continue PPI  Obesity Estimated body mass index is 30.1 kg/m as calculated from the following:   Height as of this encounter: '5\' 8"'$  (1.727 m).   Weight as of this encounter: 89.8 kg.   DVT Prophylaxis: SCDs Code Status: Full code Family  Communication: Discussed with patient Disposition Plan: Hopefully return home when improved  Status is: Inpatient Remains inpatient appropriate because: Concern for seizure disorder     Medications: Scheduled:  fluticasone  2 spray Each Nare Daily   pantoprazole  40 mg Oral BID   Continuous:   [id number] **OR** acetaminophen, hydrOXYzine, midazolam, ondansetron **OR** ondansetron (ZOFRAN) IV, saline, zolpidem  Antibiotics: Anti-infectives (From admission, onward)    None       Objective:  Vital Signs  Vitals:   09/05/22 2330 09/06/22 0354 09/06/22 0733 09/06/22 1200  BP: 137/80 115/72 136/86 127/83  Pulse: 74 70 66 73  Resp: '20 18 18 18  '$ Temp: 98.4 F (36.9 C) 98.2 F (36.8 C) 98.4 F (36.9 C) 98.5 F (36.9 C)  TempSrc: Oral Oral  Oral  SpO2: 98% 97% 99% 99%  Weight:      Height:       No intake or output data in the 24 hours ending 09/06/22 1929  Filed Weights   09/04/22 1100  Weight: 89.8 kg    Clear to auscultation. S1-S2 present  Clear to auscultation. Lower extremity show no edema. No focal deficit    Lab Results:  Data Reviewed: I have personally reviewed following labs and reports of the imaging studies  CBC: Recent Labs  Lab 09/03/22 1949 09/04/22 0435  WBC 8.2 7.7  NEUTROABS 5.0  --   HGB 15.0 14.3  HCT 42.6 41.2  MCV 87.1 87.5  PLT 220 198     Basic Metabolic Panel: Recent Labs  Lab 09/03/22 1949 09/04/22 0105 09/04/22 0435  NA 136  --  137  K 3.9  --  3.8  CL 104  --  104  CO2 21*  --  23  GLUCOSE 111*  --  109*  BUN 13  --  11  CREATININE 0.95  --  0.84  CALCIUM 9.8  --  9.2  MG  --  2.0 2.1  PHOS  --  4.2 4.2     GFR: Estimated Creatinine Clearance: 119.7 mL/min (by C-G formula based on SCr of 0.84 mg/dL).  Liver Function Tests: Recent Labs  Lab 09/03/22 1949 09/04/22 0435  AST 26 19  ALT 28 25  ALKPHOS 84 86  BILITOT 0.4 0.5  PROT 7.1 6.9  ALBUMIN 4.2 3.9      Recent Labs  Lab  09/03/22 2014  AMMONIA 24     Coagulation Profile: Recent Labs  Lab 09/04/22 0105  INR 1.0     Cardiac Enzymes: Recent Labs  Lab 09/04/22 0105  CKTOTAL 51      CBG: Recent Labs  Lab 09/03/22 2019  GLUCAP 111*     Lipid Profile: Recent Labs    09/04/22 0105  CHOL 179  HDL 36*  LDLCALC 106*  TRIG 184*  CHOLHDL 5.0     Thyroid Function Tests: Recent Labs    09/04/22 0435  TSH 3.707     Radiology Studies: Overnight EEG with video  Result Date: 09/05/2022 Lora Havens, MD     09/05/2022  8:59 AM Patient Name: [name] [name] MRN: [id number] Epilepsy Attending: Lora Havens Referring Physician/Provider: Donnetta Simpers, MD Duration: 09/04/2022 0827 to 09/05/2022 0827 Patient history: 47 year old male with new onset seizure-like episodes. EEG to evaluate for seizure Level of alertness: Awake, asleep AEDs during EEG study: None Technical aspects: This EEG study was done with scalp electrodes positioned according to the 10-20 International system of electrode placement. Electrical activity was reviewed with band pass filter of 1-'70Hz'$ , sensitivity of 7 uV/mm, display speed of 68m/sec with a '60Hz'$  notched filter applied as appropriate. EEG data were recorded continuously and digitally stored.  Video monitoring was available and reviewed as appropriate. Description: The posterior dominant rhythm consists of 10 Hz activity of moderate voltage (25-35 uV) seen predominantly in posterior head regions, symmetric and reactive to eye opening and eye closing. Sleep was characterized by vertex waves, sleep spindles (12 to 14 Hz), maximal frontocentral region, posterior occipital sharp transients of sleep.   Event button was pressed on 09/04/2022 at 1[place] Patient states he is feeling tired. Concomitant EEG before, during and after the event showed normal posterior dominant rhythm. Hyperventilation did not show any EEG change.  Physiologic photic driving was seen during photic  stimulation.  IMPRESSION: This study is within normal limits. No seizures or epileptiform discharges were seen throughout the recording. Event button was pressed on 09/04/2022 at 1[place] Patient states he is feeling tired. Concomitant EEG showed normal posterior dominant rhythm. This was most likely NOT an epileptic event. [place]      LOS: 2 days   [facility] Pager on [email or web address]  09/06/2022, 7:29 PM

## 2022-09-07 ENCOUNTER — Other Ambulatory Visit (HOSPITAL_COMMUNITY): Payer: Self-pay

## 2022-09-07 DIAGNOSIS — R569 Unspecified convulsions: Secondary | ICD-10-CM | POA: Diagnosis not present

## 2022-09-07 DIAGNOSIS — R55 Syncope and collapse: Secondary | ICD-10-CM | POA: Diagnosis not present

## 2022-09-07 DIAGNOSIS — R42 Dizziness and giddiness: Secondary | ICD-10-CM

## 2022-09-07 DIAGNOSIS — R404 Transient alteration of awareness: Secondary | ICD-10-CM

## 2022-09-07 DIAGNOSIS — G40909 Epilepsy, unspecified, not intractable, without status epilepticus: Secondary | ICD-10-CM | POA: Diagnosis not present

## 2022-09-07 MED ORDER — CLONAZEPAM 1 MG PO TABS
1.0000 mg | ORAL_TABLET | ORAL | 0 refills | Status: DC | PRN
  Filled 2022-09-07 (×2): qty 5, 5d supply, fill #0

## 2022-09-07 MED ORDER — FLUTICASONE PROPIONATE 50 MCG/ACT NA SUSP
2.0000 | Freq: Every day | NASAL | 0 refills | Status: DC
  Filled 2022-09-07: qty 16, 30d supply, fill #0

## 2022-09-07 MED ORDER — CLONAZEPAM 0.5 MG PO TBDP: 1.0000 mg | ORAL_TABLET | ORAL | Status: DC | PRN

## 2022-09-07 MED ORDER — HYDROXYZINE HCL 25 MG PO TABS
50.0000 mg | ORAL_TABLET | Freq: Three times a day (TID) | ORAL | 0 refills | Status: DC | PRN
  Filled 2022-09-07: qty 60, 10d supply, fill #0

## 2022-09-07 NOTE — Progress Notes (Signed)
IV removed, telemetry discontinued. Discharge instructions discussed and all questions answered.   Gwendolyn Grant, RN

## 2022-09-07 NOTE — Procedures (Addendum)
Patient Name: [name] [name]  MRN: [id number]  Epilepsy Attending: Lora Havens  Referring Physician/Provider: Donnetta Simpers, MD  Duration: 09/06/2022 0827 to 09/07/2022 1008   Patient history: 47 year old male with new onset seizure-like episodes. EEG to evaluate for seizure   Level of alertness: Awake, asleep   AEDs during EEG study: None   Technical aspects: This EEG study was done with scalp electrodes positioned according to the 10-20 International system of electrode placement. Electrical activity was reviewed with band pass filter of 1-'70Hz'$ , sensitivity of 7 uV/mm, display speed of 75m/sec with a '60Hz'$  notched filter applied as appropriate. EEG data were recorded continuously and digitally stored.  Video monitoring was available and reviewed as appropriate.   Description: The posterior dominant rhythm consists of 10 Hz activity of moderate voltage (25-35 uV) seen predominantly in posterior head regions, symmetric and reactive to eye opening and eye closing. Sleep was characterized by vertex waves, sleep spindles (12 to 14 Hz), maximal frontocentral region, posterior occipital sharp transients of sleep.     IMPRESSION: This study is within normal limits. No seizures or epileptiform discharges were seen throughout the recording.   Edmar Blankenburg OBarbra Sarks

## 2022-09-07 NOTE — Plan of Care (Signed)

## 2022-09-07 NOTE — Progress Notes (Signed)
Discharged to home.  Transported in w/c accompanied by wife.  TOC meds given.

## 2022-09-07 NOTE — Progress Notes (Signed)
LTM EEG disconnected - no skin breakdown at unhook. [facility] notified.   

## 2022-09-07 NOTE — Progress Notes (Signed)
vLTM maintenance  All impedances below 10kohms.  No skin breakdown noted at all skin sites 

## 2022-09-07 NOTE — Progress Notes (Signed)
Subjective: NAEO  ROS: negative except above  Examination  Vital signs in last 24 hours: Temp:  [97.5 F (36.4 C)-99.2 F (37.3 C)] 97.5 F (36.4 C) (03/07 0741) Pulse Rate:  [64-81] 81 (03/07 0741) Resp:  [16-20] 20 (03/07 0741) BP: (115-133)/(79-97) 117/96 (03/07 0741) SpO2:  [97 %-99 %] 99 % (03/07 0741)  General: lying in bed, NAD Neuro: MS: Alert, oriented, follows commands CN: pupils equal and reactive,  EOMI, face symmetric, tongue midline, normal sensation over face, Motor: 5/5 strength in all 4 extremities Coordination: normal Gait: not tested    Basic Metabolic Panel: Recent Labs  Lab 09/03/22 1949 09/04/22 0105 09/04/22 0435  NA 136  --  137  K 3.9  --  3.8  CL 104  --  104  CO2 21*  --  23  GLUCOSE 111*  --  109*  BUN 13  --  11  CREATININE 0.95  --  0.84  CALCIUM 9.8  --  9.2  MG  --  2.0 2.1  PHOS  --  4.2 4.2    CBC: Recent Labs  Lab 09/03/22 1949 09/04/22 0435  WBC 8.2 7.7  NEUTROABS 5.0  --   HGB 15.0 14.3  HCT 42.6 41.2  MCV 87.1 87.5  PLT 220 198    Coagulation Studies: No results for input(s): "LABPROT", "INR" in the last 72 hours.  Imaging No new brain imaging overnight     ASSESSMENT AND PLAN: 47 year old male with new onset seizure-like episodes.   Seizure-like episodes -Patient had 1 episode of fall followed by seizure-like activity while he was in [place].  He was started on Keppra.  Since then he has had episodes of alteration of awareness, staring off, mild movements that is an limit smacking.Marland Kitchen  Unfortunately no typical episode was captured during the hospitalization but we did stop Keppra and EEG for 72+ hours did not show any definite ictal -interictal activity.  Given lack of risk factors, the semiology of the episodes, normal EEG and MRI (done at outside hospital), I suspect these episodes are nonepileptic.  Patient is experiencing side effects with Keppra.  Therefore we will DC Keppra. -Will give prescription for  clonazepam 1 mg only to be used for generalized tonic-clonic seizure lasting more than 2 minutes.  Do not use more than 1 dose.  Please call 911 after administering clonazepam -Also discussed with patient and wife at bedside that if episodes recur after going home, can consider ambulatory EEG. -I have placed an order for follow-up with Dr. April Manson -Has Zio patch already ordered by his PCP.  Will follow-up with cardiology Dr. Alvester Chou -If all this workup remains negative, I have discussed with patient and wife that the episodes that could be related to recent stressors.  Also counseled him about avoiding substances including vaping, cannabis use -Discussed seizure precautions including do not drive for 6 months  Seizure precautions: Per [facility] statutes, patients with seizures are not allowed to drive until they have been seizure-free for six months and cleared by a physician    Use caution when using heavy equipment or power tools. Avoid working on ladders or at heights. Take showers instead of baths. Ensure the water temperature is not too high on the home water heater. Do not go swimming alone. Do not lock yourself in a room alone (i.e. bathroom). When caring for infants or small children, sit down when holding, feeding, or changing them to minimize risk of injury to the child in the  event you have a seizure. Maintain good sleep hygiene. Avoid alcohol.    If patient has another seizure, call 911 and bring them back to the ED if: A.  The seizure lasts longer than 5 minutes.      B.  The patient doesn't wake shortly after the seizure or has new problems such as difficulty seeing, speaking or moving following the seizure C.  The patient was injured during the seizure D.  The patient has a temperature over 102 F (39C) E.  The patient vomited during the seizure and now is having trouble breathing    During the Seizure   - First, ensure adequate ventilation and place patients on the floor on  their left side  Loosen clothing around the neck and ensure the airway is patent. If the patient is clenching the teeth, do not force the mouth open with any object as this can cause severe damage - Remove all items from the surrounding that can be hazardous. The patient may be oblivious to what's happening and may not even know what he or she is doing. If the patient is confused and wandering, either gently guide him/her away and block access to outside areas - Reassure the individual and be comforting - Call 911. In most cases, the seizure ends before EMS arrives. However, there are cases when seizures may last over 3 to 5 minutes. Or the individual may have developed breathing difficulties or severe injuries. If a pregnant patient or a person with diabetes develops a seizure, it is prudent to call an ambulance. - Finally, if the patient does not regain full consciousness, then call EMS. Most patients will remain confused for about 45 to 90 minutes after a seizure, so you must use judgment in calling for help.    After the Seizure (Postictal Stage)   After a seizure, most patients experience confusion, fatigue, muscle pain and/or a headache. Thus, one should permit the individual to sleep. For the next few days, reassurance is essential. Being calm and helping reorient the person is also of importance.   Most seizures are painless and end spontaneously. Seizures are not harmful to others but can lead to complications such as stress on the lungs, brain and the heart. Individuals with prior lung problems may develop labored breathing and respiratory distress.    I have spent a total of  40 minutes with the patient reviewing hospital notes,  test results, labs and examining the patient as well as establishing an assessment and plan that was discussed personally with the patient and wife.  > 50% of time was spent in direct patient care.   Zeb Comfort Epilepsy [facility] For  questions after 5pm please refer to AMION to reach the Neurologist on call

## 2022-09-07 NOTE — TOC Transition Note (Signed)
Transition of Care [facility]) - CM/SW Discharge Note   Patient Details  Name: [name] [name] MRN: [id number] Date of Birth: Mar 12, 1976  [facility] CM/SW Contact:  Pollie Friar, RN Phone Number: 09/07/2022, 9:49 AM   Clinical Narrative:    Pt is discharging home with self care. Medications for home to be delivered to the patients room per [place].  Pt has transport home.   Final next level of care: Home/Self Care Barriers to Discharge: No Barriers Identified   Patient Goals and CMS Choice      Discharge Placement                         Discharge Plan and Services Additional resources added to the After Visit Summary for                                       Social Determinants of Health (SDOH) Interventions SDOH Screenings   Food Insecurity: No Food Insecurity (09/04/2022)  Housing: Low Risk  (09/04/2022)  Transportation Needs: No Transportation Needs (09/04/2022)  Utilities: Not At Risk (09/04/2022)  Depression (PHQ2-9): Low Risk  (09/01/2022)  Tobacco Use: Medium Risk (09/04/2022)     Readmission Risk Interventions     No data to display

## 2022-09-08 ENCOUNTER — Encounter: Payer: Self-pay | Admitting: *Deleted

## 2022-09-08 ENCOUNTER — Telehealth: Payer: Self-pay | Admitting: *Deleted

## 2022-09-08 NOTE — Discharge Summary (Signed)
Physician Discharge Summary   Patient: [name] [name] MRN: [id number] DOB: [birth year]  Admit date:     09/03/2022  Discharge date: 09/07/2022  Discharge Physician: Berle Mull  PCP: [name], MD  Recommendations at discharge: Follow-up with PCP in 1 week. Follow-up with neurology as recommended. Patient plans to have a follow-up with cardiology   Follow-up Information     [name], MD. Schedule an appointment as soon as possible for a visit in 1 week(s).   Specialty: Internal Medicine Contact information: [place] [address] [phone]         Lora Havens, MD. Call.   Specialty: Neurology Why: As needed Contact information: [address] [place] [address] [phone]                Discharge Diagnoses: Principal Problem:   Seizure disorder [facility]) Active Problems:   GERD (gastroesophageal reflux disease)   Pre-syncope   Weight loss   Seizure ([place])  Assessment and Plan  Syncopal episode/seizure type activity/history of seizure disorder CT head did not show any acute findings. Seen by neurology. Underwent continuous EEG monitoring which was negative for any acute seizures at all. Patient was on Keppra which was discontinued. Neurology recommended to use Klonopin as needed. Patient had a few episodes of reported "spells" not correlated to the EEG. Outpatient follow-up with neurology recommended.   Chest pain Reported some chest pain once.  EKG was unremarkable.  Troponin negative.  Echocardiogram unremarkable. Chest x-ray unremarkable. Pain resolved on its own. No further workup for now.  Weight loss Outpatient management.  He has been under a lot of stress recently which could be contributing.  Blood work reviewed.  TSH noted to be normal.   GERD Continue PPI   Obesity Body mass index is 30.1 kg/m.  Placing the patient at high risk of poor outcome.  Anxiety. Patient reported multiple  episodes of anxiety. Would not prescribe benzodiazepine given its abuse potential as well as the risk for seizures in a situation when not on this medication. SSRIs will lower the seizure threshold this adequate option. Recommend to use was started.  Consultants:  Neurology  Procedures performed:  Long-term EEG monitoring Echocardiogram  DISCHARGE MEDICATION: Allergies as of 09/07/2022       Reactions   Hydrocodone    Hyper        Medication List     STOP taking these medications    levETIRAcetam 1000 MG tablet Commonly known as: KEPPRA   omeprazole 40 MG capsule Commonly known as: PRILOSEC       TAKE these medications    clonazePAM 1 MG tablet Commonly known as: KLONOPIN Take 1 tablet (1 mg total) by mouth as needed (GTC seizure lasting over 2 minutes. Do not use more than once in 24 hours).   fluticasone 50 MCG/ACT nasal spray Commonly known as: FLONASE Place 2 sprays into both nostrils daily.   hydrOXYzine 25 MG tablet Commonly known as: ATARAX Take 2 tablets (50 mg total) by mouth 3 (three) times daily as needed for anxiety.   zolpidem 10 MG tablet Commonly known as: AMBIEN TAKE 1 TABLET BY MOUTH AT BEDTIME AS NEEDED FOR SLEEP.INS ALLOWS 15TAB/25 DAYS What changed: See the new instructions.       Disposition: Home Diet recommendation: Cardiac diet  Discharge Exam: Vitals:   09/06/22 1932 09/06/22 2306 09/07/22 0320 09/07/22 0741  BP: (!) 133/97 115/79 (!) 123/90 (!) 117/96  Pulse: 72 69 64  81  Resp: '16 18 16 20  '$ Temp: 98.1 F (36.7 C) 99.2 F (37.3 C) 98.3 F (36.8 C) (!) 97.5 F (36.4 C)  TempSrc: Oral Oral Oral Oral  SpO2: 99% 97% 97% 99%  Weight:      Height:       General: Appear in no distress; no visible Abnormal Neck Mass Or lumps, Conjunctiva normal Cardiovascular: S1 and S2 Present, no Murmur, Respiratory: good respiratory effort, Bilateral Air entry present and CTA, no Crackles, no wheezes Abdomen: Bowel Sound present, Non  tender  Extremities: no Pedal edema Neurology: alert and oriented to time, place, and person  Filed Weights   09/04/22 1100  Weight: 89.8 kg   Condition at discharge: stable  The results of significant diagnostics from this hospitalization (including imaging, microbiology, ancillary and laboratory) are listed below for reference.   Imaging Studies: Overnight EEG with video  Result Date: 09/05/2022 Lora Havens, MD     09/05/2022  8:59 AM Patient Name: [name] [name] MRN: [id number] Epilepsy Attending: Lora Havens Referring Physician/Provider: Donnetta Simpers, MD Duration: 09/04/2022 0827 to 09/05/2022 0827 Patient history: 47 year old male with new onset seizure-like episodes. EEG to evaluate for seizure Level of alertness: Awake, asleep AEDs during EEG study: None Technical aspects: This EEG study was done with scalp electrodes positioned according to the 10-20 International system of electrode placement. Electrical activity was reviewed with band pass filter of 1-'70Hz'$ , sensitivity of 7 uV/mm, display speed of 74m/sec with a '60Hz'$  notched filter applied as appropriate. EEG data were recorded continuously and digitally stored.  Video monitoring was available and reviewed as appropriate. Description: The posterior dominant rhythm consists of 10 Hz activity of moderate voltage (25-35 uV) seen predominantly in posterior head regions, symmetric and reactive to eye opening and eye closing. Sleep was characterized by vertex waves, sleep spindles (12 to 14 Hz), maximal frontocentral region, posterior occipital sharp transients of sleep.   Event button was pressed on 09/04/2022 at 1[place] Patient states he is feeling tired. Concomitant EEG before, during and after the event showed normal posterior dominant rhythm. Hyperventilation did not show any EEG change.  Physiologic photic driving was seen during photic stimulation.  IMPRESSION: This study is within normal limits. No seizures or epileptiform  discharges were seen throughout the recording. Event button was pressed on 09/04/2022 at 1[place] Patient states he is feeling tired. Concomitant EEG showed normal posterior dominant rhythm. This was most likely NOT an epileptic event. PLora Havens  ECHOCARDIOGRAM COMPLETE  Result Date: 09/04/2022    ECHOCARDIOGRAM REPORT   Patient Name:   [name] VanderzandenDate of Exam: 09/04/2022 Medical Rec #:  [id number]        Height:       68.0 in Accession #:    [id number]       Weight:       198.0 lb Date of Birth:  3Jun 25, 1977        BSA:          2.035 m Patient Age:    89years          BP:           135/84 mmHg Patient Gender: M                 HR:           80 bpm. Exam Location:  Inpatient Procedure: 2D Echo Indications:    syncope  History:  Patient has no prior history of Echocardiogram examinations.                 Risk Factors:Current Smoker.  Sonographer:    Harvie Junior Referring Phys: Toy Baker  Sonographer Comments: Technically difficult study due to poor echo windows. IMPRESSIONS  1. Poor acoustic windows limit study. . Left ventricular ejection fraction, by estimation, is 55 to 60%. The left ventricle has normal function. Left ventricular diastolic parameters were normal.  2. Right ventricular systolic function is normal. The right ventricular size is normal.  3. Trivial mitral valve regurgitation.  4. The aortic valve is normal in structure. Aortic valve regurgitation is not visualized.  5. The inferior vena cava is normal in size with greater than 50% respiratory variability, suggesting right atrial pressure of 3 mmHg. FINDINGS  Left Ventricle: Poor acoustic windows limit study. Left ventricular ejection fraction, by estimation, is 55 to 60%. The left ventricle has normal function. The left ventricular internal cavity size was normal in size. There is no left ventricular hypertrophy. Left ventricular diastolic parameters were normal. Right Ventricle: The right ventricular size is normal.  Right vetricular wall thickness was not assessed. Right ventricular systolic function is normal. Left [facility]: Left atrial size was normal in size. Right [facility]: Right atrial size was normal in size. Pericardium: There is no evidence of pericardial effusion. Mitral Valve: There is mild thickening of the mitral valve leaflet(s). There is mild calcification of the mitral valve leaflet(s). Trivial mitral valve regurgitation. Tricuspid Valve: The tricuspid valve is grossly normal. Tricuspid valve regurgitation is not demonstrated. Aortic Valve: The aortic valve is normal in structure. Aortic valve regurgitation is not visualized. Aortic valve mean gradient measures 2.5 mmHg. Aortic valve peak gradient measures 4.8 mmHg. Aortic valve area, by VTI measures 3.28 cm. Pulmonic Valve: The pulmonic valve was grossly normal. Pulmonic valve regurgitation is not visualized. Aorta: The aortic root and ascending aorta are structurally normal, with no evidence of dilitation. Venous: The inferior vena cava is normal in size with greater than 50% respiratory variability, suggesting right atrial pressure of 3 mmHg. IAS/Shunts: No atrial level shunt detected by color flow Doppler.  LEFT VENTRICLE PLAX 2D LVIDd:         4.60 cm      Diastology LVIDs:         3.00 cm      LV e' medial:    6.42 cm/s LV PW:         0.90 cm      LV E/e' medial:  8.1 LV IVS:        0.90 cm      LV e' lateral:   13.10 cm/s LVOT diam:     2.00 cm      LV E/e' lateral: 4.0 LV SV:         51 LV SV Index:   25 LVOT Area:     3.14 cm  LV Volumes (MOD) LV vol d, MOD A2C: 61.0 ml LV vol d, MOD A4C: 103.0 ml LV vol s, MOD A2C: 26.0 ml LV vol s, MOD A4C: 39.6 ml LV SV MOD A2C:     35.0 ml LV SV MOD A4C:     103.0 ml LV SV MOD BP:      46.3 ml RIGHT VENTRICLE RV Basal diam:  2.80 cm RV Mid diam:    2.50 cm RV S prime:     20.10 cm/s TAPSE (M-mode): 2.6 cm LEFT ATRIUM  Index        RIGHT ATRIUM          Index LA diam:        3.00 cm 1.47 cm/m   RA Area:      8.01 cm LA Vol (A2C):   30.4 ml 14.94 ml/m  RA Volume:   12.20 ml 6.00 ml/m LA Vol (A4C):   32.1 ml 15.77 ml/m LA Biplane Vol: 31.4 ml 15.43 ml/m  AORTIC VALVE                    PULMONIC VALVE AV Area (Vmax):    2.63 cm     PV Vmax:       1.02 m/s AV Area (Vmean):   2.68 cm     PV Peak grad:  4.2 mmHg AV Area (VTI):     3.28 cm AV Vmax:           109.00 cm/s AV Vmean:          71.050 cm/s AV VTI:            0.154 m AV Peak Grad:      4.8 mmHg AV Mean Grad:      2.5 mmHg LVOT Vmax:         91.20 cm/s LVOT Vmean:        60.700 cm/s LVOT VTI:          0.161 m LVOT/AV VTI ratio: 1.05  AORTA Ao Root diam: 3.50 cm Ao Asc diam:  3.70 cm MITRAL VALVE MV Area (PHT): 3.31 cm    SHUNTS MV Decel Time: 229 msec    Systemic VTI:  0.16 m MV E velocity: 52.20 cm/s  Systemic Diam: 2.00 cm MV A velocity: 63.20 cm/s MV E/A ratio:  0.83 Dorris Carnes MD Electronically signed by Dorris Carnes MD Signature Date/Time: 09/04/2022/12:28:04 PM    Final    DG CHEST PORT 1 VIEW  Result Date: 09/03/2022 CLINICAL DATA:  Syncope EXAM: PORTABLE CHEST 1 VIEW COMPARISON:  None Available. FINDINGS: Lungs are clear.  No pleural effusion or pneumothorax. The heart is normal in size. IMPRESSION: No evidence of acute cardiopulmonary disease. Electronically Signed   By: Julian Hy M.D.   On: 09/03/2022 23:18   CT Head Wo Contrast  Result Date: 09/03/2022 CLINICAL DATA:  Seizure. EXAM: CT HEAD WITHOUT CONTRAST TECHNIQUE: Contiguous axial images were obtained from the base of the skull through the vertex without intravenous contrast. RADIATION DOSE REDUCTION: This exam was performed according to the departmental dose-optimization program which includes automated exposure control, adjustment of the mA and/or kV according to patient size and/or use of iterative reconstruction technique. COMPARISON:  None Available. FINDINGS: Brain: The ventricles and sulci are appropriate size for the patient's age. The gray-white matter discrimination is  preserved. There is no acute intracranial hemorrhage. No mass effect or midline shift. No extra-axial fluid collection. Vascular: No hyperdense vessel or unexpected calcification. Skull: Normal. Negative for fracture or focal lesion. Sinuses/Orbits: Diffuse mucoperiosteal thickening of paranasal sinuses. No air-fluid level. The mastoid air cells are clear. Other: None IMPRESSION: 1. No acute intracranial pathology. 2. Paranasal sinus disease. Electronically Signed   By: Anner Crete M.D.   On: 09/03/2022 20:40    Microbiology: Results for orders placed or performed during the hospital encounter of 09/03/22  Resp panel by RT-PCR (RSV, Flu A&B, Covid) Anterior Nasal Swab     Status: None   Collection Time: 09/03/22 11:23 PM   Specimen:  Anterior Nasal Swab  Result Value Ref Range Status   SARS Coronavirus 2 by RT PCR NEGATIVE NEGATIVE Final   Influenza A by PCR NEGATIVE NEGATIVE Final   Influenza B by PCR NEGATIVE NEGATIVE Final    Comment: (NOTE) The Xpert Xpress SARS-CoV-2/FLU/RSV plus assay is intended as an aid in the diagnosis of influenza from Nasopharyngeal swab specimens and should not be used as a sole basis for treatment. Nasal washings and aspirates are unacceptable for Xpert Xpress SARS-CoV-2/FLU/RSV testing.  Fact Sheet for Patients: EntrepreneurPulse.com.au  Fact Sheet for Healthcare Providers: IncredibleEmployment.be  This test is not yet approved or cleared by the [place] FDA and has been authorized for detection and/or diagnosis of SARS-CoV-2 by FDA under an Emergency Use Authorization (EUA). This EUA will remain in effect (meaning this test can be used) for the duration of the COVID-19 declaration under Section 564(b)(1) of the Act, 21 U.S.C. section 360bbb-3(b)(1), unless the authorization is terminated or revoked.     Resp Syncytial Virus by PCR NEGATIVE NEGATIVE Final    Comment: (NOTE) Fact Sheet for  Patients: EntrepreneurPulse.com.au  Fact Sheet for Healthcare Providers: IncredibleEmployment.be  This test is not yet approved or cleared by the [place] FDA and has been authorized for detection and/or diagnosis of SARS-CoV-2 by FDA under an Emergency Use Authorization (EUA). This EUA will remain in effect (meaning this test can be used) for the duration of the COVID-19 declaration under Section 564(b)(1) of the Act, 21 U.S.C. section 360bbb-3(b)(1), unless the authorization is terminated or revoked.  Performed at [facility], [place] [address], [place], [place] [address]    Labs: CBC: Recent Labs  Lab 09/03/22 1949 09/04/22 0435  WBC 8.2 7.7  NEUTROABS 5.0  --   HGB 15.0 14.3  HCT 42.6 41.2  MCV 87.1 87.5  PLT 220 [id number]   Basic Metabolic Panel: Recent Labs  Lab 09/03/22 1949 09/04/22 0105 09/04/22 0435  NA 136  --  137  K 3.9  --  3.8  CL 104  --  104  CO2 21*  --  23  GLUCOSE 111*  --  109*  BUN 13  --  11  CREATININE 0.95  --  0.84  CALCIUM 9.8  --  9.2  MG  --  2.0 2.1  PHOS  --  4.2 4.2   Liver Function Tests: Recent Labs  Lab 09/03/22 1949 09/04/22 0435  AST 26 19  ALT 28 25  ALKPHOS 84 86  BILITOT 0.4 0.5  PROT 7.1 6.9  ALBUMIN 4.2 3.9   CBG: Recent Labs  Lab 09/03/22 2019  GLUCAP 111*    Discharge time spent: greater than 30 minutes.  Signed: Berle Mull, MD [facility] 09/07/2022

## 2022-09-08 NOTE — Transitions of Care (Post Inpatient/ED Visit) (Signed)
09/08/2022  Name: [name] [name] MRN: [id number] DOB: 01/08/1976  Today's TOC FU Call Status: Today's TOC FU Call Status:: Successful TOC FU Call Competed TOC FU Call Complete Date: 09/08/22  Transition Care Management Follow-up Telephone Call Date of Discharge: 09/07/22 Discharge Facility: [facility]) Type of Discharge: Inpatient Admission Primary Inpatient Discharge Diagnosis:: seizure type actvity How have you been since you were released from the hospital?: Better ("I am doing good since being released from the hospital; wearing the heart monitor; taking care of myself without any issues.  I want to know what the heart ultrasound results were and will talk to Dr. Sharlet Salina as you have suggested for specific results") Any questions or concerns?: Yes Patient Questions/Concerns:: would like results of ECHcardiogram performed 09/04/22 Patient Questions/Concerns Addressed: Other: (provided low level results as per radiology; encouraged patient to discuss details of result report with PCP at tme of upcoming office visit 09/18/22; provided education around difference between pumping and electrical cardiac function)  Items Reviewed: Did you receive and understand the discharge instructions provided?: Yes (thoroughly reviewed with patient who verbalizes excellent understanding of same) Medications obtained and verified?: Yes (Medications Reviewed) (full medication review completed; no concerns or discrepancies identified; confirmed patient obtained/ is taking all newly Rx'd medications as instructed- except Atarax; self-manages medications and denies questions/ concerns around medications today) Any new allergies since your discharge?: No Dietary orders reviewed?: Yes Type of Diet Ordered:: "Heart Healthy" Do you have support at home?: Yes People in Home: spouse Name of Support/Comfort Primary Source: reports independent in self-care activities; spouse assists as indicated/ needed  [place] and Equipment/Supplies: [place] Ordered?: No Any new equipment or medical supplies ordered?: No  Functional Questionnaire: Do you need assistance with bathing/showering or dressing?: No Do you need assistance with meal preparation?: No Do you need assistance with eating?: No Do you have difficulty maintaining continence: No Do you need assistance with getting out of bed/getting out of a chair/moving?: No Do you have difficulty managing or taking your medications?: No  Folllow up appointments reviewed: PCP Follow-up appointment confirmed?: Yes Date of PCP follow-up appointment?: 09/15/22 Follow-up Provider: PCP [facility] Follow-up appointment confirmed?: No (referrals for specialists pending PCP office visit; patient states, "they have pretty much ruled out eplipsy, so we'll see if Dr. Sharlet Salina wants me to go to the neurologist and/ or the cardiologist") Reason Specialist Follow-Up Not Confirmed:  (referrals for specialists pending PCP office visit; patient states, "they have pretty much ruled out eplipsy, so we'll see if Dr. Sharlet Salina wants me to go to the neurologist and/ or the cardiologist") Do you need transportation to your follow-up appointment?: No Do you understand care options if your condition(s) worsen?: Yes-patient verbalized understanding  SDOH Interventions Today    Flowsheet Row Most Recent Value  SDOH Interventions   Food Insecurity Interventions Intervention Not Indicated  Transportation Interventions Intervention Not Indicated  [verified patient understands current driving restrictions- he confirms spouse providing all transportation]      TOC Interventions Today    Flowsheet Row Most Recent Value  TOC Interventions   TOC Interventions Discussed/Reviewed TOC Interventions Discussed  [provided my direct contact information should questions/ concerns/ needs arise post-TOC call]      Interventions Today    Flowsheet Row Most  Recent Value  Chronic Disease   Chronic disease during today's visit Other  [seizure-like activity]  General Interventions   General Interventions Discussed/Reviewed General Interventions Discussed, Doctor Visits  [overview of ECHO results with  advice to follow up on specific analysis with PCP]  Doctor Visits Discussed/Reviewed Doctor Visits Discussed, PCP, Specialist  PCP/Specialist Visits Compliance with follow-up visit  Education Interventions   Education Provided Provided Education  Provided Verbal Education On Other  [purpose of/ importance of having updated DPR completed,  difference between pumping and electrical function of heart]  Mental Health Interventions   Mental Health Discussed/Reviewed Anxiety, Mental Health Discussed  Nutrition Interventions   Nutrition Discussed/Reviewed Nutrition Discussed  Pharmacy Interventions   Pharmacy Dicussed/Reviewed Pharmacy Topics Discussed  [full medication review,  Atarax as antihistamine for anxiety: patient reports he did not obtain at time of hospital discharge,  reports he will discuss with Dr. Sharlet Salina on 09/15/22 office visit]      Oneta Rack, RN, BSN, CCRN Alumnus RN CM Care Coordination/ Transition of [place] Management [phone]: direct office

## 2022-09-11 ENCOUNTER — Telehealth: Payer: Self-pay | Admitting: Internal Medicine

## 2022-09-11 DIAGNOSIS — R569 Unspecified convulsions: Secondary | ICD-10-CM

## 2022-09-11 DIAGNOSIS — R55 Syncope and collapse: Secondary | ICD-10-CM

## 2022-09-11 NOTE — Telephone Encounter (Signed)
Called Pt and let him know Referral has been placed.

## 2022-09-11 NOTE — Telephone Encounter (Signed)
Referral placed although I am not sure he needs to see cardiology. He could choose to wait on results on monitor.

## 2022-09-11 NOTE — Telephone Encounter (Signed)
I do not see reason for referral listed. We would need this

## 2022-09-11 NOTE — Telephone Encounter (Signed)
Patient needs a referral sent to cardiologist - Dr. Lorie Phenix - they can see patient sooner if they can get a referral sent in today by Dr. Sharlet Salina.  Please let patient know when referral has been placed.  He has appointment with Dr. Sharlet Salina on Friday.    Phone:  [phone]

## 2022-09-15 ENCOUNTER — Encounter: Payer: Self-pay | Admitting: Cardiovascular Disease

## 2022-09-15 ENCOUNTER — Ambulatory Visit (INDEPENDENT_AMBULATORY_CARE_PROVIDER_SITE_OTHER): Payer: BC Managed Care – PPO | Admitting: Internal Medicine

## 2022-09-15 ENCOUNTER — Encounter: Payer: Self-pay | Admitting: Internal Medicine

## 2022-09-15 ENCOUNTER — Ambulatory Visit: Payer: BC Managed Care – PPO | Attending: Cardiovascular Disease | Admitting: Cardiovascular Disease

## 2022-09-15 VITALS — BP 110/68 | HR 87 | Temp 98.3°F | Ht 68.0 in | Wt 198.0 lb

## 2022-09-15 VITALS — BP 118/74 | HR 71 | Ht 68.5 in | Wt 199.4 lb

## 2022-09-15 DIAGNOSIS — R569 Unspecified convulsions: Secondary | ICD-10-CM

## 2022-09-15 DIAGNOSIS — F39 Unspecified mood [affective] disorder: Secondary | ICD-10-CM

## 2022-09-15 DIAGNOSIS — R55 Syncope and collapse: Secondary | ICD-10-CM | POA: Diagnosis not present

## 2022-09-15 MED ORDER — HYDROXYZINE HCL 10 MG PO TABS: 5.0000 mg | ORAL_TABLET | Freq: Two times a day (BID) | ORAL | 3 refills | Status: DC | PRN

## 2022-09-15 NOTE — Assessment & Plan Note (Signed)
Has tried buspar again recently due to stress of health events and recent hospitalization. This causes some tinnitus for first 20 minutes. Will try 5-10 mg hydroxyzine for anxiety as needed only advised this can cause drowsiness. He did take this inpatient at 25 mg with some drowsiness.

## 2022-09-15 NOTE — Patient Instructions (Signed)
Medication Instructions:  Your physician recommends that you continue on your current medications as directed. Please refer to the Current Medication list given to you today.  *If you need a refill on your cardiac medications before your next appointment, please call your pharmacy*    Follow-Up: At [facility], you and your health needs are our priority.  As part of our continuing mission to provide you with exceptional heart care, we have created designated Provider Care Teams.  These Care Teams include your primary Cardiologist (physician) and Advanced Practice Providers (APPs -  Physician Assistants and Nurse Practitioners) who all work together to provide you with the care you need, when you need it.  We recommend signing up for the patient portal called "MyChart".  Sign up information is provided on this After Visit Summary.  MyChart is used to connect with patients for Virtual Visits (Telemedicine).  Patients are able to view lab/test results, encounter notes, upcoming appointments, etc.  Non-urgent messages can be sent to your provider as well.   To learn more about what you can do with MyChart, go to [email or web address].    Your next appointment:   We will see you on an as needed basis.  Provider:   Jonathan Berry, MD  

## 2022-09-15 NOTE — Progress Notes (Signed)
09/15/2022 [name] [name]   [birth year]  [id number]  Primary Physician [name] Koch, MD Primary Cardiologist: [name] Harp MD [name] [name], [place]  HPI:  [name] [name] is a 47 y.o. 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  He has no cardiac risk factors other than a mother who had a heart attack in her late 28s.  He does smoke THC but not tobacco.  He drinks occasional scotch.  He is never had heart attack or stroke.  He denies chest pain or shortness of breath.  He was down in [place] with his wife [name] [name] for a concert and had witnessed loss of consciousness while standing in line with 7 to 10 minutes of seizure activity.  EMS was called.  His workup in the hospital was unrevealing.  He had 6-8 subsequent episodes and has been evaluated at [facility] was negative.  2D echo was normal.  Blood work only showed UDS positive for THC.  He chest pain is wearing a 7-day Zio patch.   Current Meds  Medication Sig   busPIRone (BUSPAR) 7.5 MG tablet Take 7.5 mg by mouth 3 (three) times daily. PRN   clonazePAM (KLONOPIN) 1 MG tablet Take 1 tablet (1 mg total) by mouth as needed (GTC seizure lasting over 2 minutes. Do not use more than once in 24 hours).   hydrOXYzine (ATARAX) 10 MG tablet Take 0.5-1 tablets (5-10 mg total) by mouth 2 (two) times daily as needed.   zolpidem (AMBIEN) 10 MG tablet TAKE 1 TABLET BY MOUTH AT BEDTIME AS NEEDED FOR SLEEP.INS ALLOWS 15TAB/25 DAYS (Patient taking differently: Take 10 mg by mouth at bedtime.)   [DISCONTINUED] fluticasone (FLONASE) 50 MCG/ACT nasal spray Place 2 sprays into both nostrils daily.     Allergies  Allergen Reactions   Hydrocodone     Hyper     Social History   Socioeconomic History   Marital status: Married    Spouse name: Not on file   Number of children: Not on file   Years of education: Not on file    Highest education level: Not on file  Occupational History   Not on file  Tobacco Use   Smoking status: Former    Types: Cigarettes    Quit date: 07/03/2008    Years since quitting: 14.2    Passive exposure: Past   Smokeless tobacco: Never  Substance and Sexual Activity   Alcohol use: Yes    Comment: social   Drug use: No   Sexual activity: Not on file  Other Topics Concern   Not on file  Social History Narrative   Not on file   Social Determinants of Health   Financial Resource Strain: Not on file  Food Insecurity: No Food Insecurity (09/08/2022)   Hunger Vital Sign    Worried About Running Out of Food in the Last Year: Never true    Ran Out of Food in the Last Year: Never true  Transportation Needs: No Transportation Needs (09/08/2022)   PRAPARE - Hydrologist (Medical): No    Lack of Transportation (Non-Medical): No  Physical Activity: Not on file  Stress: Not on file  Social Connections: Not on file  Intimate Partner Violence: Not At Risk (09/04/2022)   Humiliation, Afraid, Rape, and Kick questionnaire    Fear of Current or Ex-Partner: No    Emotionally  Abused: No    Physically Abused: No    Sexually Abused: No     Review of Systems: General: negative for chills, fever, night sweats or weight changes.  Cardiovascular: negative for chest pain, dyspnea on exertion, edema, orthopnea, palpitations, paroxysmal nocturnal dyspnea or shortness of breath Dermatological: negative for rash Respiratory: negative for cough or wheezing Urologic: negative for hematuria Abdominal: negative for nausea, vomiting, diarrhea, bright red blood per rectum, melena, or hematemesis Neurologic: negative for visual changes, syncope, or dizziness All other systems reviewed and are otherwise negative except as noted above.    Blood pressure 118/74, pulse 71, height 5' 8.5" (1.74 m), weight 199 lb 6.4 oz (90.4 kg), SpO2 97 %.  General appearance: alert and no  distress Neck: no adenopathy, no carotid bruit, no JVD, supple, symmetrical, trachea midline, and thyroid not enlarged, symmetric, no tenderness/mass/nodules Lungs: clear to auscultation bilaterally Heart: regular rate and rhythm, S1, S2 normal, no murmur, click, rub or gallop Extremities: extremities normal, atraumatic, no cyanosis or edema Pulses: 2+ and symmetric Skin: Skin color, texture, turgor normal. No rashes or lesions Neurologic: Grossly normal  EKG sinus rhythm at 71 without ST or T wave changes.  Personally reviewed this EKG.  ASSESSMENT AND PLAN:   Syncope and collapse [name] is being seen for evaluation of syncope.  First episode was when he was in [place] with his wife on 08/22/2022 in line waiting to see a concert.  He had sudden loss of consciousness with seizure activity that was witnessed.  He was evaluated in the hospital there without significant findings.  He had neurologic workup including MRI.  He has had a total of 6 episodes the subsequent ones of which have been less severe.  He has an aura before an episode and then has passed out.  He was hospitalized at [facility] for these episodes where the workup was unrevealing as well.  2D echo was normal.  Head CT was unremarkable.  Lab work was unremarkable.  He was sent home with a 7-day Zio patch which she has finished wearing and is about to send in.  He apparently had an episode while wearing the patch.  If his rhythm was normal during this episode no further evaluation is warranted.  I did tell him that if there is no etiology found he cannot drive for 6 months.     [name] Harp MD FACP,FACC,FAHA, [facility] 09/15/2022 1:59 PM

## 2022-09-15 NOTE — Assessment & Plan Note (Addendum)
With recent episodes of seizure like activity not confirmed on EEG, started on keppra due to intractible nature. While recently inpatient EEG during episode without seizure activity. Keppra was stopped. He has had several episodes since leaving hospital while wearing heart monitor. Seeing cardiology later today. 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 Monitor patient had with him asked to take to cardiology today. If no episodes with heart during seizure like activity we did discuss mental health as a cause of this rarely. ECHO normal with recent inpatient stay. He is under a lot of stress. Given work note for desk duty next two weeks he plans to return to work on Monday pending cardiology visit.

## 2022-09-15 NOTE — Progress Notes (Signed)
   Subjective:   Patient ID: [name] [name], male    DOB: [birth year], 47 y.o.   MRN: [id number]  HPI The patient is a 47 YO man coming in for hospital follow up (in for seizure like activity with workup including ECHO and EEG with discontinuation of seizure medicine without seizure activity during episode). He has had 1-2 episodes since discharge and they seem similar but not as severe. Was wearing heart monitor during episodes. No chest pains. He is weaning [place] and doing okay with sleep (taking 1/3 ambien for sleep). Denies nausea or vomiting. With some brain fog after episodes. Some sleepiness. Gets a pre-episode warning cannot stop episode.   Review of Systems  Constitutional:  Positive for activity change and appetite change.  HENT: Negative.    Eyes: Negative.   Respiratory:  Negative for cough, chest tightness and shortness of breath.   Cardiovascular:  Negative for chest pain, palpitations and leg swelling.  Gastrointestinal:  Negative for abdominal distention, abdominal pain, constipation, diarrhea, nausea and vomiting.  Musculoskeletal: Negative.   Skin: Negative.   Neurological: Negative.        Seizure-like episodes  Psychiatric/Behavioral: Negative.      Objective:  Physical Exam Constitutional:      Appearance: He is well-developed.  HENT:     Head: Normocephalic and atraumatic.  Cardiovascular:     Rate and Rhythm: Normal rate and regular rhythm.  Pulmonary:     Effort: Pulmonary effort is normal. No respiratory distress.     Breath sounds: Normal breath sounds. No wheezing or rales.  Abdominal:     General: Bowel sounds are normal. There is no distension.     Palpations: Abdomen is soft.     Tenderness: There is no abdominal tenderness. There is no rebound.  Musculoskeletal:     Cervical back: Normal range of motion.  Skin:    General: Skin is warm and dry.  Neurological:     Mental Status: He is alert and oriented to person, place, and time.      Coordination: Coordination normal.     Vitals:   09/15/22 1027  BP: 110/68  Pulse: 87  Temp: 98.3 F (36.8 C)  TempSrc: Oral  SpO2: 97%  Weight: 198 lb (89.8 kg)  Height: 5\' 8"  (1.727 m)    Assessment & Plan:  Visit time 25 minutes in face to face communication with patient and coordination of care, additional 15 minutes spent in record review, coordination or care, ordering tests, communicating/referring to other healthcare professionals, documenting in medical records all on the same day of the visit for total time 40 minutes spent on the visit.

## 2022-09-15 NOTE — Assessment & Plan Note (Signed)
[name] [name] is being seen for evaluation of syncope.  First episode was when he was in [place] with his wife on 08/22/2022 in line waiting to see a concert.  He had sudden loss of consciousness with seizure activity that was witnessed.  He was evaluated in the hospital there without significant findings.  He had neurologic workup including MRI.  He has had a total of 6 episodes the subsequent ones of which have been less severe.  He has an aura before an episode and then has passed out.  He was hospitalized at [facility] for these episodes where the workup was unrevealing as well.  2D echo was normal.  Head CT was unremarkable.  Lab work was unremarkable.  He was sent home with a 7-day Zio patch which she has finished wearing and is about to send in.  He apparently had an episode while wearing the patch.  If his rhythm was normal during this episode no further evaluation is warranted.  I did tell him that if there is no etiology found he cannot drive for 6 months.

## 2022-09-22 ENCOUNTER — Other Ambulatory Visit: Payer: Self-pay | Admitting: Internal Medicine

## 2022-10-13 ENCOUNTER — Ambulatory Visit (INDEPENDENT_AMBULATORY_CARE_PROVIDER_SITE_OTHER): Payer: BC Managed Care – PPO | Admitting: Internal Medicine

## 2022-10-13 ENCOUNTER — Encounter: Payer: Self-pay | Admitting: Internal Medicine

## 2022-10-13 VITALS — BP 120/70 | HR 90 | Temp 98.3°F | Ht 68.5 in | Wt 203.2 lb

## 2022-10-13 DIAGNOSIS — F39 Unspecified mood [affective] disorder: Secondary | ICD-10-CM

## 2022-10-13 DIAGNOSIS — R569 Unspecified convulsions: Secondary | ICD-10-CM | POA: Diagnosis not present

## 2022-10-13 MED ORDER — CLONAZEPAM 0.5 MG PO TABS: 0.5000 mg | ORAL_TABLET | Freq: Every day | ORAL | 0 refills | Status: DC | PRN

## 2022-10-13 NOTE — Patient Instructions (Signed)
We have refilled the clonazepam to use as needed.   We will get you in with counseling.

## 2022-10-13 NOTE — Progress Notes (Signed)
   Subjective:   Patient ID: [name] [name], male    DOB: July 22, 1975, 47 y.o.   MRN: [id number]  HPI The patient is a 47 YO man coming in for concerns about ongoing stress, anxiety, mood change. Neurology and cardiology have not cleared ascertained an etiology for his seizure like episodes. 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 He is on waiting list for ambulatory EEG from neurology they told him it would like be 45-60 day wait for that. Patient and wife have some concerns about concussion causing some cognitive changes. They are improving. Still struggling. Event recently at rodeo with episode not shaking but out of it and change in behavior.   Review of Systems  Constitutional:  Positive for activity change.  HENT: Negative.    Eyes: Negative.   Respiratory:  Negative for cough, chest tightness and shortness of breath.   Cardiovascular:  Negative for chest pain, palpitations and leg swelling.  Gastrointestinal:  Negative for abdominal distention, abdominal pain, constipation, diarrhea, nausea and vomiting.  Musculoskeletal: Negative.   Skin: Negative.   Neurological: Negative.   Psychiatric/Behavioral:  Positive for decreased concentration, dysphoric mood and sleep disturbance. The patient is nervous/anxious.     Objective:  Physical Exam Constitutional:      Appearance: He is well-developed.  HENT:     Head: Normocephalic and atraumatic.  Cardiovascular:     Rate and Rhythm: Normal rate and regular rhythm.  Pulmonary:     Effort: Pulmonary effort is normal. No respiratory distress.     Breath sounds: Normal breath sounds. No wheezing or rales.  Abdominal:     General: Bowel sounds are normal. There is no distension.     Palpations: Abdomen is soft.     Tenderness: There is no abdominal tenderness. There is no rebound.  Musculoskeletal:     Cervical back: Normal range of motion.  Skin:    General: Skin is warm and dry.  Neurological:     Mental Status: He is  alert and oriented to person, place, and time.     Coordination: Coordination normal.     Vitals:   10/13/22 0909  BP: 120/70  Pulse: 90  Temp: 98.3 F (36.8 C)  TempSrc: Oral  SpO2: 98%  Weight: 203 lb 4 oz (92.2 kg)  Height: 5' 8.5" (1.74 m)    Assessment & Plan:  Visit time 20 minutes in face to face communication with patient and coordination of care, additional 10 minutes spent in record review, coordination or care, ordering tests, communicating/referring to other healthcare professionals, documenting in medical records all on the same day of the visit for total time 30 minutes spent on the visit.

## 2022-10-13 NOTE — Assessment & Plan Note (Signed)
Visit with neurology since our last visit and they are trying to arrange outpatient EEG. I asked patient and wife to try to trigger an event while they are wearing this. It seems public events he is more likely to trigger (initial at a concert, recently at a rodeo). He is also having some smaller episodes at home. Asked them to monitor stress and see if this is linked to episodes. Concussion could be possible from initial large fall. He did have damage to teeth and getting 9 teeth removed in near future from that event which he is struggling with. Counseled about possibility for medicine to help with mood daily. He would like to try for a month with as needed clonazepam and if unable to cope by then he will let us know. Cardiology evaluation returned normal. Still under driving restrictions.

## 2022-10-18 ENCOUNTER — Ambulatory Visit: Payer: BC Managed Care – PPO | Admitting: Cardiovascular Disease

## 2022-11-02 ENCOUNTER — Ambulatory Visit: Payer: BLUE CROSS/BLUE SHIELD | Admitting: Neurology

## 2022-11-29 ENCOUNTER — Other Ambulatory Visit: Payer: Self-pay | Admitting: Internal Medicine

## 2022-12-20 ENCOUNTER — Other Ambulatory Visit: Payer: Self-pay | Admitting: Internal Medicine

## 2023-01-08 ENCOUNTER — Other Ambulatory Visit: Payer: Self-pay | Admitting: Internal Medicine

## 2023-02-07 ENCOUNTER — Other Ambulatory Visit: Payer: Self-pay | Admitting: Internal Medicine

## 2023-02-20 ENCOUNTER — Encounter: Payer: Self-pay | Admitting: Internal Medicine

## 2023-02-21 ENCOUNTER — Ambulatory Visit (INDEPENDENT_AMBULATORY_CARE_PROVIDER_SITE_OTHER): Payer: BC Managed Care – PPO

## 2023-02-21 ENCOUNTER — Ambulatory Visit: Payer: BC Managed Care – PPO | Admitting: Internal Medicine

## 2023-02-21 ENCOUNTER — Encounter: Payer: Self-pay | Admitting: Internal Medicine

## 2023-02-21 ENCOUNTER — Telehealth: Payer: Self-pay | Admitting: Internal Medicine

## 2023-02-21 VITALS — BP 132/92 | HR 75 | Temp 97.9°F | Resp 16 | Ht 68.5 in | Wt 195.0 lb

## 2023-02-21 DIAGNOSIS — R569 Unspecified convulsions: Secondary | ICD-10-CM | POA: Diagnosis not present

## 2023-02-21 DIAGNOSIS — F1210 Cannabis abuse, uncomplicated: Secondary | ICD-10-CM

## 2023-02-21 DIAGNOSIS — I10 Essential (primary) hypertension: Secondary | ICD-10-CM | POA: Insufficient documentation

## 2023-02-21 DIAGNOSIS — R10817 Generalized abdominal tenderness: Secondary | ICD-10-CM | POA: Diagnosis not present

## 2023-02-21 DIAGNOSIS — R197 Diarrhea, unspecified: Secondary | ICD-10-CM

## 2023-02-21 NOTE — Telephone Encounter (Signed)
Pt had an appointment today at 10:00 am, was sent down for labs and to give a urine sample. Pt came back up after he was done in the lab and was sent to wait in room 2.   At 12:30 he came out and told me that he has been waiting for 2 hours but no one came back in to see him, no one took his urine sample. He said he can't wait anymore because he has to take his daughter to another appointment.   I apologized and told him we would get the sample from the exam room and call him with follow up information. Pt and his wife were both upset with these circumstances and sited this as ' completely unacceptable'. I apologized again and left. I was asked by admin to document the encounter.

## 2023-02-21 NOTE — Progress Notes (Signed)
Subjective:  Patient ID: [name] [name], male    DOB: September 15, 1975  Age: 47 y.o. MRN: [id number]  CC: Hypertension and Abdominal Pain   HPI [name] [name] presents for f/up ----    Discussed the use of AI scribe software for clinical note transcription with the patient, who gave verbal consent to proceed.  History of Present Illness   The patient, with a recent history of seizures, presents with recurrent episodes of feeling unwell, characterized by a sensation of heat and clamminess, followed by coldness and a sense of being 'zoned out.' These episodes are often preceded by gastrointestinal discomfort, which the patient describes as originating from the stomach and moving upwards, but without associated nausea or vomiting. The patient also reports a change in bowel habits, with recent onset diarrhea since the past few days. He also mentions abdominal discomfort, which has been present for about a week.  The patient's episodes of feeling unwell are not associated with seizures, but he describes them as similar to the prodromal symptoms experienced before his previous seizures. The patient has been on seizure prophylaxis since his initial seizure episode, which occurred during a concert trip to [place], and involved multiple seizures leading to hospitalization.  The patient has made significant lifestyle changes since his seizure episodes, including a shift to a healthier diet, which he believes has reduced his previous issues with acid reflux. He has stopped taking omeprazole, which was previously prescribed for this condition.  The patient also reports a change in his cognitive function, with a slower processing speed and difficulty in putting things together. He has been evaluated by a neurologist and a cardiologist, with no significant findings to explain his symptoms.  The patient uses cannabis and occasionally takes clonazepam. He also takes Ambien for insomnia, usually at a half  dose unless he is having a particularly bad night.   The patient denies any thoughts of self-harm or harm to others. He also denies any painful swallowing or trouble swallowing and has not noticed any blood in his stool. He is not currently taking any pain medications.       Outpatient Medications Prior to Visit  Medication Sig Dispense Refill   busPIRone (BUSPAR) 7.5 MG tablet Take 7.5 mg by mouth 3 (three) times daily. PRN     clonazePAM (KLONOPIN) 0.5 MG tablet TAKE 1 TABLET (0.5 MG TOTAL) BY MOUTH DAILY AS NEEDED (GTC SEIZURE LASTING OVER 2 MINUTES. DO NOT USE MORE THAN ONCE IN 24 HOURS). 30 tablet 0   NAYZILAM 5 MG/0.1ML SOLN PLEASE SEE ATTACHED FOR DETAILED DIRECTIONS     omeprazole (PRILOSEC) 40 MG capsule TAKE 1 CAPSULE (40 MG TOTAL) BY MOUTH DAILY. 90 capsule 0   zolpidem (AMBIEN) 10 MG tablet TAKE 1 TABLET BY MOUTH AT BEDTIME AS NEEDED FOR SLEEP.INS ALLOWS 15TAB/25 DAYS 15 tablet 5   No facility-administered medications prior to visit.    ROS Review of Systems  Constitutional:  Positive for fatigue and unexpected weight change (wt loss). Negative for appetite change, chills, diaphoresis and fever.  HENT: Negative.  Negative for sore throat and trouble swallowing.   Eyes: Negative.   Respiratory: Negative.  Negative for cough, chest tightness, shortness of breath and wheezing.   Cardiovascular:  Negative for chest pain, palpitations and leg swelling.  Gastrointestinal:  Positive for diarrhea. Negative for abdominal pain, blood in stool, constipation, nausea and vomiting.  Endocrine: Negative.   Genitourinary: Negative.  Negative for difficulty urinating.  Musculoskeletal: Negative.  Negative for arthralgias.  Skin: Negative.  Negative for rash.  Neurological: Negative.   Hematological:  Negative for adenopathy. Does not bruise/bleed easily.  Psychiatric/Behavioral:  Positive for confusion, decreased concentration and sleep disturbance. Negative for behavioral problems,  self-injury and suicidal ideas. The patient is nervous/anxious. The patient is not hyperactive.     Objective:  BP (!) 132/92 (BP Location: Right Arm, Patient Position: Sitting, Cuff Size: Large)   Pulse 75   Temp 97.9 F (36.6 C) (Oral)   Resp 16   Ht 5' 8.5" (1.74 m)   Wt 195 lb (88.5 kg)   SpO2 96%   BMI 29.22 kg/m   BP Readings from Last 3 Encounters:  02/21/23 (!) 132/92  10/13/22 120/70  09/15/22 118/74    Wt Readings from Last 3 Encounters:  02/21/23 195 lb (88.5 kg)  10/13/22 203 lb 4 oz (92.2 kg)  09/15/22 199 lb 6.4 oz (90.4 kg)    Physical Exam Vitals reviewed.  Constitutional:      Appearance: He is well-developed. He is not ill-appearing.  HENT:     Nose: Nose normal.     Mouth/Throat:     Mouth: Mucous membranes are moist.  Eyes:     General: No scleral icterus.    Conjunctiva/sclera: Conjunctivae normal.  Cardiovascular:     Rate and Rhythm: Normal rate and regular rhythm.     Heart sounds: No murmur heard. Pulmonary:     Effort: Pulmonary effort is normal.     Breath sounds: No stridor. No wheezing, rhonchi or rales.  Abdominal:     General: Abdomen is flat. Bowel sounds are normal. There is no distension.     Palpations: Abdomen is soft. There is no fluid wave, hepatomegaly, splenomegaly or mass.     Tenderness: There is generalized abdominal tenderness. There is no right CVA tenderness, left CVA tenderness, guarding or rebound.     Hernia: No hernia is present.  Musculoskeletal:        General: Normal range of motion.     Cervical back: Neck supple.     Right lower leg: No edema.     Left lower leg: No edema.  Lymphadenopathy:     Cervical: No cervical adenopathy.  Skin:    General: Skin is warm and dry.  Neurological:     General: No focal deficit present.     Mental Status: He is alert. Mental status is at baseline.  Psychiatric:        Mood and Affect: Mood normal.        Behavior: Behavior normal.        Thought Content: Thought  content normal.        Judgment: Judgment normal.     Lab Results  Component Value Date   WBC 7.7 09/04/2022   HGB 14.3 09/04/2022   HCT 41.2 09/04/2022   PLT 198 09/04/2022   GLUCOSE 109 (H) 09/04/2022   CHOL 179 09/04/2022   TRIG 184 (H) 09/04/2022   HDL 36 (L) 09/04/2022   LDLDIRECT 122.0 08/10/2021   LDLCALC 106 (H) 09/04/2022   ALT 25 09/04/2022   AST 19 09/04/2022   NA 137 09/04/2022   K 3.8 09/04/2022   CL 104 09/04/2022   CREATININE 0.84 09/04/2022   BUN 11 09/04/2022   CO2 23 09/04/2022   TSH 3.707 09/04/2022   INR 1.0 09/04/2022   HGBA1C 5.8 08/10/2021    ECHOCARDIOGRAM COMPLETE  Result Date: 09/04/2022    ECHOCARDIOGRAM REPORT   Patient Name:  Leim Fabry Date of Exam: 09/04/2022 Medical Rec #:  [id number]         Height:       68.0 in Accession #:    [id number]        Weight:       198.0 lb Date of Birth:  [birth year]         BSA:          2.035 m Patient Age:    46 years          BP:           135/84 mmHg Patient Gender: M                 HR:           80 bpm. Exam Location:  Inpatient Procedure: 2D Echo Indications:    syncope  History:        Patient has no prior history of Echocardiogram examinations.                 Risk Factors:Current Smoker.  Sonographer:    Cathie Hoops Referring Phys: Therisa Doyne  Sonographer Comments: Technically difficult study due to poor echo windows. IMPRESSIONS  1. Poor acoustic windows limit study. . Left ventricular ejection fraction, by estimation, is 55 to 60%. The left ventricle has normal function. Left ventricular diastolic parameters were normal.  2. Right ventricular systolic function is normal. The right ventricular size is normal.  3. Trivial mitral valve regurgitation.  4. The aortic valve is normal in structure. Aortic valve regurgitation is not visualized.  5. The inferior vena cava is normal in size with greater than 50% respiratory variability, suggesting right atrial pressure of 3 mmHg. FINDINGS  Left Ventricle:  Poor acoustic windows limit study. Left ventricular ejection fraction, by estimation, is 55 to 60%. The left ventricle has normal function. The left ventricular internal cavity size was normal in size. There is no left ventricular hypertrophy. Left ventricular diastolic parameters were normal. Right Ventricle: The right ventricular size is normal. Right vetricular wall thickness was not assessed. Right ventricular systolic function is normal. Left Atrium: Left atrial size was normal in size. Right Atrium: Right atrial size was normal in size. Pericardium: There is no evidence of pericardial effusion. Mitral Valve: There is mild thickening of the mitral valve leaflet(s). There is mild calcification of the mitral valve leaflet(s). Trivial mitral valve regurgitation. Tricuspid Valve: The tricuspid valve is grossly normal. Tricuspid valve regurgitation is not demonstrated. Aortic Valve: The aortic valve is normal in structure. Aortic valve regurgitation is not visualized. Aortic valve mean gradient measures 2.5 mmHg. Aortic valve peak gradient measures 4.8 mmHg. Aortic valve area, by VTI measures 3.28 cm. Pulmonic Valve: The pulmonic valve was grossly normal. Pulmonic valve regurgitation is not visualized. Aorta: The aortic root and ascending aorta are structurally normal, with no evidence of dilitation. Venous: The inferior vena cava is normal in size with greater than 50% respiratory variability, suggesting right atrial pressure of 3 mmHg. IAS/Shunts: No atrial level shunt detected by color flow Doppler.  LEFT VENTRICLE PLAX 2D LVIDd:         4.60 cm      Diastology LVIDs:         3.00 cm      LV e' medial:    6.42 cm/s LV PW:         0.90 cm      LV E/e' medial:  8.1 LV IVS:  0.90 cm      LV e' lateral:   13.10 cm/s LVOT diam:     2.00 cm      LV E/e' lateral: 4.0 LV SV:         51 LV SV Index:   25 LVOT Area:     3.14 cm  LV Volumes (MOD) LV vol d, MOD A2C: 61.0 ml LV vol d, MOD A4C: 103.0 ml LV vol s, MOD  A2C: 26.0 ml LV vol s, MOD A4C: 39.6 ml LV SV MOD A2C:     35.0 ml LV SV MOD A4C:     103.0 ml LV SV MOD BP:      46.3 ml RIGHT VENTRICLE RV Basal diam:  2.80 cm RV Mid diam:    2.50 cm RV S prime:     20.10 cm/s TAPSE (M-mode): 2.6 cm LEFT ATRIUM             Index        RIGHT ATRIUM          Index LA diam:        3.00 cm 1.47 cm/m   RA Area:     8.01 cm LA Vol (A2C):   30.4 ml 14.94 ml/m  RA Volume:   12.20 ml 6.00 ml/m LA Vol (A4C):   32.1 ml 15.77 ml/m LA Biplane Vol: 31.4 ml 15.43 ml/m  AORTIC VALVE                    PULMONIC VALVE AV Area (Vmax):    2.63 cm     PV Vmax:       1.02 m/s AV Area (Vmean):   2.68 cm     PV Peak grad:  4.2 mmHg AV Area (VTI):     3.28 cm AV Vmax:           109.00 cm/s AV Vmean:          71.050 cm/s AV VTI:            0.154 m AV Peak Grad:      4.8 mmHg AV Mean Grad:      2.5 mmHg LVOT Vmax:         91.20 cm/s LVOT Vmean:        60.700 cm/s LVOT VTI:          0.161 m LVOT/AV VTI ratio: 1.05  AORTA Ao Root diam: 3.50 cm Ao Asc diam:  3.70 cm MITRAL VALVE MV Area (PHT): 3.31 cm    SHUNTS MV Decel Time: 229 msec    Systemic VTI:  0.16 m MV E velocity: 52.20 cm/s  Systemic Diam: 2.00 cm MV A velocity: 63.20 cm/s MV E/A ratio:  0.83 Dietrich Pates MD Electronically signed by Dietrich Pates MD Signature Date/Time: 09/04/2022/12:28:04 PM    Final    DG CHEST PORT 1 VIEW  Result Date: 09/03/2022 CLINICAL DATA:  Syncope EXAM: PORTABLE CHEST 1 VIEW COMPARISON:  None Available. FINDINGS: Lungs are clear.  No pleural effusion or pneumothorax. The heart is normal in size. IMPRESSION: No evidence of acute cardiopulmonary disease. Electronically Signed   By: Charline Bills M.D.   On: 09/03/2022 23:18   CT Head Wo Contrast  Result Date: 09/03/2022 CLINICAL DATA:  Seizure. EXAM: CT HEAD WITHOUT CONTRAST TECHNIQUE: Contiguous axial images were obtained from the base of the skull through the vertex without intravenous contrast. RADIATION DOSE REDUCTION: This exam was performed according to  the departmental dose-optimization  program which includes automated exposure control, adjustment of the mA and/or kV according to patient size and/or use of iterative reconstruction technique. COMPARISON:  None Available. FINDINGS: Brain: The ventricles and sulci are appropriate size for the patient's age. The gray-white matter discrimination is preserved. There is no acute intracranial hemorrhage. No mass effect or midline shift. No extra-axial fluid collection. Vascular: No hyperdense vessel or unexpected calcification. Skull: Normal. Negative for fracture or focal lesion. Sinuses/Orbits: Diffuse mucoperiosteal thickening of paranasal sinuses. No air-fluid level. The mastoid air cells are clear. Other: None IMPRESSION: 1. No acute intracranial pathology. 2. Paranasal sinus disease. Electronically Signed   By: Elgie Collard M.D.   On: 09/03/2022 20:40   DG ABD ACUTE 2+V W 1V CHEST  Result Date: 02/21/2023 CLINICAL DATA:  Mid abdominal pain common diarrhea EXAM: DG ABDOMEN ACUTE WITH 1 VIEW CHEST COMPARISON:  Chest radiograph 09/03/2022 FINDINGS: Chest: The cardiomediastinal silhouette is normal There is no focal consolidation or pulmonary edema. There is no pleural effusion or pneumothorax There is no acute osseous abnormality. Abdomen: There is a nonobstructive bowel gas pattern. There is no gross organomegaly or abnormal soft tissue calcification. There is no free intraperitoneal air. There is no acute osseous abnormality. IMPRESSION: Unremarkable chest and abdominal radiographs with no acute finding. Electronically Signed   By: Lesia Hausen M.D.   On: 02/21/2023 12:19     Assessment & Plan:   Seizure-like activity (HCC) -     Urine drugs of abuse scrn w alc, routine (Ref Lab); Future  Primary hypertension- He has not achieved his blood pressure goal of 130/80.  Will check labs to screen for secondary causes and endorgan damage. -     Urinalysis, Routine w reflex microscopic; Future -     Urine  drugs of abuse scrn w alc, routine (Ref Lab); Future -     Basic metabolic panel; Future  Mild tetrahydrocannabinol (THC) abuse- I am concerned this is contributing to his symptoms. -     Urine drugs of abuse scrn w alc, routine (Ref Lab); Future  Generalized abdominal tenderness without rebound tenderness- Plain films are reassuring.  Will check labs to see if there is an acute abdominal process. -     Lipase; Future -     Amylase; Future -     Urinalysis, Routine w reflex microscopic; Future -     Hepatic function panel; Future -     C-reactive protein; Future -     Urine drugs of abuse scrn w alc, routine (Ref Lab); Future -     DG ABD ACUTE 2+V W 1V CHEST; Future  Diarrhea of presumed infectious origin -     GI Profile, Stool, PCR; Future -     C-reactive protein; Future     Follow-up: No follow-ups on file.  Sanda Linger, MD

## 2023-02-22 LAB — URINALYSIS, ROUTINE W REFLEX MICROSCOPIC
Bilirubin Urine: NEGATIVE
Hgb urine dipstick: NEGATIVE
Ketones, ur: NEGATIVE
Leukocytes,Ua: NEGATIVE
Nitrite: NEGATIVE
RBC / HPF: NONE SEEN (ref 0–?)
Specific Gravity, Urine: 1.01 (ref 1.000–1.030)
Total Protein, Urine: NEGATIVE
Urine Glucose: NEGATIVE
Urobilinogen, UA: 0.2 (ref 0.0–1.0)
WBC, UA: NONE SEEN (ref 0–?)
pH: 6.5 (ref 5.0–8.0)

## 2023-02-23 ENCOUNTER — Other Ambulatory Visit (INDEPENDENT_AMBULATORY_CARE_PROVIDER_SITE_OTHER): Payer: BC Managed Care – PPO

## 2023-02-23 DIAGNOSIS — R197 Diarrhea, unspecified: Secondary | ICD-10-CM

## 2023-02-23 DIAGNOSIS — R10817 Generalized abdominal tenderness: Secondary | ICD-10-CM

## 2023-02-23 DIAGNOSIS — I10 Essential (primary) hypertension: Secondary | ICD-10-CM

## 2023-02-23 LAB — BASIC METABOLIC PANEL
BUN: 11 mg/dL (ref 6–23)
CO2: 26 mEq/L (ref 19–32)
Calcium: 9.5 mg/dL (ref 8.4–10.5)
Chloride: 106 mEq/L (ref 96–112)
Creatinine, Ser: 0.95 mg/dL (ref 0.40–1.50)
GFR: 95.37 mL/min (ref >60.00)
Glucose, Bld: 111 mg/dL — ABNORMAL HIGH (ref 70–99)
Potassium: 4 mEq/L (ref 3.5–5.1)
Sodium: 139 mEq/L (ref 135–145)

## 2023-02-23 LAB — AMYLASE: Amylase: 36 U/L (ref 27–131)

## 2023-02-23 LAB — HEPATIC FUNCTION PANEL
ALT: 17 U/L (ref 0–53)
AST: 21 U/L (ref 0–37)
Albumin: 4.2 g/dL (ref 3.5–5.2)
Alkaline Phosphatase: 68 U/L (ref 39–117)
Bilirubin, Direct: 0.1 mg/dL (ref 0.0–0.3)
Total Bilirubin: 0.4 mg/dL (ref 0.2–1.2)
Total Protein: 6.9 g/dL (ref 6.0–8.3)

## 2023-02-23 LAB — C-REACTIVE PROTEIN: CRP: <1 mg/dL (ref 0.5–20.0)

## 2023-02-23 LAB — LIPASE: Lipase: 41 U/L (ref 11.0–59.0)

## 2023-02-24 LAB — URINE DRUGS OF ABUSE SCREEN W ALC, ROUTINE (REF LAB)
Amphetamines, Urine: NEGATIVE ng/mL
Barbiturate Quant, Ur: NEGATIVE ng/mL
Benzodiazepine Quant, Ur: NEGATIVE ng/mL
Cocaine (Metab.): NEGATIVE ng/mL
Ethanol, Urine: NEGATIVE %
Methadone Screen, Urine: NEGATIVE ng/mL
Opiate Quant, Ur: NEGATIVE ng/mL
PCP Quant, Ur: NEGATIVE ng/mL
Propoxyphene: NEGATIVE ng/mL

## 2023-02-24 LAB — PANEL 799049
CARBOXY THC GC/MS CONF: 274 ng/mL
Cannabinoid GC/MS, Ur: POSITIVE — AB

## 2023-03-01 LAB — GI PROFILE, STOOL, PCR

## 2023-03-20 ENCOUNTER — Encounter: Payer: Self-pay | Admitting: Internal Medicine

## 2023-03-20 ENCOUNTER — Telehealth (INDEPENDENT_AMBULATORY_CARE_PROVIDER_SITE_OTHER): Payer: BC Managed Care – PPO | Admitting: Internal Medicine

## 2023-03-20 DIAGNOSIS — R7301 Impaired fasting glucose: Secondary | ICD-10-CM

## 2023-03-20 DIAGNOSIS — R569 Unspecified convulsions: Secondary | ICD-10-CM

## 2023-03-20 NOTE — Progress Notes (Unsigned)
Virtual Visit via Video Note  I connected with [name] [name] on 03/20/23 at  4:00 PM EDT by a video enabled telemedicine application and verified that I am speaking with the correct person using two identifiers.  The patient and the provider were at separate locations throughout the entire encounter. Patient location: home, Provider location: work   I discussed the limitations of evaluation and management by telemedicine and the availability of in person appointments. The patient expressed understanding and agreed to proceed. The patient and the provider were the only parties present for the visit unless noted in HPI below.  History of Present Illness: The patient is a 48 y.o. man with visit for seizure like episode. Started this last week and another 4 weeks ago. Has some residual symptoms of mental fogginess. Doing some research and maybe they think long covid or sugars.   Observations/Objective: Appearance: normal, breathing appears normal, casual grooming, A and O times 3  Assessment and Plan: See problem oriented charting  Follow Up Instructions: Checking random insulin level and HGA1c  Visit time 25 minutes in face to face communication with patient and coordination of care, additional 5 minutes spent in record review, coordination or care, ordering tests, communicating/referring to other healthcare professionals, documenting in medical records all on the same day of the visit for total time 30 minutes spent on the visit.   I discussed the assessment and treatment plan with the patient. The patient was provided an opportunity to ask questions and all were answered. The patient agreed with the plan and demonstrated an understanding of the instructions.   The patient was advised to call back or seek an in-person evaluation if the symptoms worsen or if the condition fails to improve as anticipated.  Myrlene Broker, MD

## 2023-03-21 ENCOUNTER — Telehealth: Payer: Self-pay | Admitting: Internal Medicine

## 2023-03-21 NOTE — Telephone Encounter (Signed)
Patient had a virtual appointment with Dr. Okey Dupre yesterday. They had discussed medications and he would like to start one she had recommended. Patient would like a call back at [phone].

## 2023-03-21 NOTE — Assessment & Plan Note (Signed)
Checking random insulin level and HgA1c. It is true that sometimes with pre-diabetes hypoglycemia and relative hypoglycemia can occur which can provoke seizure like symptoms. Given recurrence of symptoms I have advised them to complete 72 hour EEG and visit with neurology which are already scheduled. I have also recommended he consider to wean off and stop THC products as this can cause rarely psych manifestations and can likely adjust seizure threshold. He will do that.

## 2023-03-22 MED ORDER — LAMOTRIGINE 25 MG PO TABS: 25.0000 mg | ORAL_TABLET | Freq: Every day | ORAL | 0 refills | Status: DC

## 2023-03-22 NOTE — Telephone Encounter (Signed)
Sent in starting dose 25 mg daily lamcital. Follow up 1 month or sooner if needed.

## 2023-03-25 ENCOUNTER — Other Ambulatory Visit: Payer: Self-pay

## 2023-03-25 ENCOUNTER — Emergency Department (HOSPITAL_COMMUNITY): Payer: BC Managed Care – PPO

## 2023-03-25 ENCOUNTER — Encounter (HOSPITAL_COMMUNITY): Payer: Self-pay

## 2023-03-25 ENCOUNTER — Emergency Department (HOSPITAL_COMMUNITY)
Admission: EM | Admit: 2023-03-25 | Discharge: 2023-03-25 | Disposition: A | Discharge status: Home / Self Care | Payer: BC Managed Care – PPO | Attending: Emergency Medicine | Admitting: Emergency Medicine

## 2023-03-25 DIAGNOSIS — R410 Disorientation, unspecified: Secondary | ICD-10-CM | POA: Insufficient documentation

## 2023-03-25 DIAGNOSIS — R7309 Other abnormal glucose: Secondary | ICD-10-CM | POA: Insufficient documentation

## 2023-03-25 DIAGNOSIS — R569 Unspecified convulsions: Secondary | ICD-10-CM | POA: Diagnosis present

## 2023-03-25 DIAGNOSIS — D72829 Elevated white blood cell count, unspecified: Secondary | ICD-10-CM | POA: Diagnosis not present

## 2023-03-25 HISTORY — DX: Unspecified convulsions: R56.9

## 2023-03-25 LAB — BASIC METABOLIC PANEL
Anion gap: 13 (ref 5–15)
BUN: 11 mg/dL (ref 6–20)
CO2: 19 mmol/L — ABNORMAL LOW (ref 22–32)
Calcium: 9.5 mg/dL (ref 8.9–10.3)
Chloride: 104 mmol/L (ref 98–111)
Creatinine, Ser: 1.1 mg/dL (ref 0.61–1.24)
GFR, Estimated: >60 mL/min (ref >60)
Glucose, Bld: 137 mg/dL — ABNORMAL HIGH (ref 70–99)
Potassium: 4.1 mmol/L (ref 3.5–5.1)
Sodium: 136 mmol/L (ref 135–145)

## 2023-03-25 LAB — RAPID URINE DRUG SCREEN, HOSP PERFORMED
Amphetamines: NOT DETECTED
Barbiturates: NOT DETECTED
Benzodiazepines: POSITIVE — AB
Cocaine: NOT DETECTED
Opiates: NOT DETECTED
Tetrahydrocannabinol: POSITIVE — AB

## 2023-03-25 LAB — CBC
HCT: 47 % (ref 39.0–52.0)
Hemoglobin: 15.8 g/dL (ref 13.0–17.0)
MCH: 30.2 pg (ref 26.0–34.0)
MCHC: 33.6 g/dL (ref 30.0–36.0)
MCV: 89.7 fL (ref 80.0–100.0)
Platelets: 226 10*3/uL (ref 150–400)
RBC: 5.24 MIL/uL (ref 4.22–5.81)
RDW: 12.7 % (ref 11.5–15.5)
WBC: 11.5 10*3/uL — ABNORMAL HIGH (ref 4.0–10.5)
nRBC: 0 % (ref 0.0–0.2)

## 2023-03-25 LAB — URINALYSIS, ROUTINE W REFLEX MICROSCOPIC
Bilirubin Urine: NEGATIVE
Glucose, UA: NEGATIVE mg/dL
Hgb urine dipstick: NEGATIVE
Ketones, ur: NEGATIVE mg/dL
Leukocytes,Ua: NEGATIVE
Nitrite: NEGATIVE
Protein, ur: NEGATIVE mg/dL
Specific Gravity, Urine: 1.017 (ref 1.005–1.030)
pH: 7 (ref 5.0–8.0)

## 2023-03-25 LAB — ETHANOL: Alcohol, Ethyl (B): <10 mg/dL (ref <10)

## 2023-03-25 LAB — CBG MONITORING, ED: Glucose-Capillary: 144 mg/dL — ABNORMAL HIGH (ref 70–99)

## 2023-03-25 LAB — MAGNESIUM: Magnesium: 1.8 mg/dL (ref 1.7–2.4)

## 2023-03-25 MED ORDER — ACETAMINOPHEN 325 MG PO TABS
650.0000 mg | ORAL_TABLET | Freq: Once | ORAL | Status: AC
  Administered 2023-03-25: 650 mg via ORAL
  Filled 2023-03-25: qty 2

## 2023-03-25 MED ORDER — LEVETIRACETAM 500 MG PO TABS
500.0000 mg | ORAL_TABLET | Freq: Two times a day (BID) | ORAL | 0 refills | Status: AC
Start: 2023-03-25 — End: 2023-04-24

## 2023-03-25 MED ORDER — CLONAZEPAM 0.5 MG PO TABS: 0.5000 mg | ORAL_TABLET | Freq: Three times a day (TID) | ORAL | 0 refills | Status: DC | PRN

## 2023-03-25 MED ORDER — SODIUM CHLORIDE 0.9 % IV SOLN
3500.0000 mg | Freq: Once | INTRAVENOUS | Status: AC
  Administered 2023-03-25: 3500 mg via INTRAVENOUS
  Filled 2023-03-25: qty 35

## 2023-03-25 NOTE — ED Triage Notes (Signed)
Pt states his wife witnessed him having a seizure today. Pt's wife states the pt's whole body was shaking when he had a seizure and she states he hit the ground face first. Pt's wife states the first time he was grinding his teeth and feet and were curled up into chest and shaking. Pt second seizure he arched back and whole body started shaking. Pt is A&Ox4. Pt states just started taking seizure meds 3-4 days ago.

## 2023-03-25 NOTE — ED Provider Notes (Signed)
[place] EMERGENCY DEPARTMENT AT [facility] Provider Note   CSN: [id number] Arrival date & time: 03/25/23  1133     History  Chief Complaint  Patient presents with   Seizures    [name] [name] is a 47 y.o. male with PMH as listed below who presents with seizures.   Patient reports that first seizure was 08/27/22, then had clusters of them back to back for awhile, was hospitalized for a week but didn't have a seizure while he was there on keppra. Then was hospitalized in March 2024 and no seizure activity was demonstrated on EEG but didn't have episode then either. Was told he didn't have epilepsy and told he could discontinue his keppra.   Wife reports he hasn't been the same since. He will have episodes where his "head feels light" and "stares off" or "zones out" and then feels fatigued and sleeps for many hours for 2-3 days. They report significant "brain fog" and decreased PO intake w/ weight loss at least 25 lbs over 6 months. This week hasn't felt well all week, sleeping so much. It was their daughter's birthday yesterday and patient didn't want to go to dinner. This AM, wife witnessed patient fall onto ground face first and had teeth grinding, full body shaking. Lasted 1 minute, patient sat up for a minute was confused, then had another seizure lasting 1 minute. Pt was BIBEMS and while in ED had two additional episodes witnessed by nursing which involved arching of his back and whole-body shaking. Patient states just started taking lamictal 3-4 days ago, didn't take his dose this AM. Wife states over the last 6 months she feels like she is "watching him disappear." Also reports that he has been more "erratic," much more irritable. Yesterday he witnessed someone steal something from a store and wanted to chase them, became extremely angry, wife reports that is not like him. Denies EtOH, but has decreased the amount of THC he smokes. Also not as interested in that. Still a  daily THC user, which improves his symptoms some and gives him appetite.   Patient denies headache, neck pain, facial pain, asymmetric weakness, numbness/tingling, CP, SOB, cough, nausea/vomiting, abdominal pain, urinary symptoms. Endorses diarrhea for a few weeks. Denies f/c. Reports anorexia. Denies SI/HI/AH/VH.    Past Medical History:  Diagnosis Date   Anxiety    Depression    GERD (gastroesophageal reflux disease)    Seizures (HCC)        Home Medications Prior to Admission medications   Medication Sig Start Date End Date Taking? Authorizing Provider  busPIRone (BUSPAR) 7.5 MG tablet Take 7.5 mg by mouth 3 (three) times daily. PRN    [provider]  clonazePAM (KLONOPIN) 0.5 MG tablet TAKE 1 TABLET (0.5 MG TOTAL) BY MOUTH DAILY AS NEEDED (GTC SEIZURE LASTING OVER 2 MINUTES. DO NOT USE MORE THAN ONCE IN 24 HOURS). 12/21/22   Myrlene Broker, MD  lamoTRIgine (LAMICTAL) 25 MG tablet Take 1 tablet (25 mg total) by mouth daily. 03/22/23   Myrlene Broker, MD  NAYZILAM 5 MG/0.1ML SOLN PLEASE SEE ATTACHED FOR DETAILED DIRECTIONS    [provider]  omeprazole (PRILOSEC) 40 MG capsule TAKE 1 CAPSULE (40 MG TOTAL) BY MOUTH DAILY. 02/07/23   Myrlene Broker, MD  zolpidem (AMBIEN) 10 MG tablet TAKE 1 TABLET BY MOUTH AT BEDTIME AS NEEDED FOR SLEEP.INS ALLOWS 15TAB/25 DAYS 11/29/22   Myrlene Broker, MD      Allergies  Hydrocodone    Review of Systems   Review of Systems A 10 point review of systems was performed and is negative unless otherwise reported in HPI.  Physical Exam Updated Vital Signs BP 115/84   Pulse 86   Temp 98.2 F (36.8 C) (Oral)   Resp 15   Ht 5' 8.5" (1.74 m)   Wt 88.5 kg   SpO2 100%   BMI 29.23 kg/m  Physical Exam General: Normal appearing male, lying in bed.  HEENT: NCAT. PERRLA, EOMI, no nystagmus, Sclera anicteric, MMM, trachea midline. Tongue protrudes midline, normal speech.  Cardiology: RRR, no  murmurs/rubs/gallops.  Resp: Normal respiratory rate and effort. CTAB, no wheezes, rhonchi, crackles.  Abd: Soft, non-tender, non-distended. No rebound tenderness or guarding.  Pelvis: stable nontender MSK: No peripheral edema or signs of trauma.  Skin: warm, dry. Back: No midline C T or L spine TTP Neuro: A&Ox4, CNs II-XII grossly intact. 5/5 strength all extremities. Sensation grossly intact. Normal speech. Intact repetition.  Psych: Normal mood and affect.   ED Results / Procedures / Treatments   Labs (all labs ordered are listed, but only abnormal results are displayed) Labs Reviewed  BASIC METABOLIC PANEL - Abnormal; Notable for the following components:      Result Value   CO2 19 (*)    Glucose, Bld 137 (*)    All other components within normal limits  CBC - Abnormal; Notable for the following components:   WBC 11.5 (*)    All other components within normal limits  CBG MONITORING, ED - Abnormal; Notable for the following components:   Glucose-Capillary 144 (*)    All other components within normal limits  LAMOTRIGINE LEVEL  MAGNESIUM  ETHANOL  RAPID URINE DRUG SCREEN, HOSP PERFORMED  URINALYSIS, ROUTINE W REFLEX MICROSCOPIC    EKG EKG Interpretation Date/Time:  Sunday March 25 2023 11:48:46 EDT Ventricular Rate:  64 PR Interval:  153 QRS Duration:  94 QT Interval:  406 QTC Calculation: 419 R Axis:   45  Text Interpretation: Sinus rhythm Low voltage, precordial leads Confirmed by Vivi Barrack [phone]) on 03/25/2023 11:56:12 AM  Radiology No results found.  Procedures Procedures    Medications Ordered in ED Medications  levETIRAcetam (KEPPRA) 3,500 mg in sodium chloride 0.9 % 250 mL IVPB (has no administration in time range)    ED Course/ Medical Decision Making/ A&P                          Medical Decision Making Amount and/or Complexity of Data Reviewed Labs: ordered. Decision-making details documented in ED Course. Radiology: ordered.  Decision-making details documented in ED Course.  Risk OTC drugs. Prescription drug management.    This patient presents to the ED for concern of seizures today, this involves an extensive number of treatment options, and is a complaint that carries with it a high risk of complications and morbidity.  I considered the following differential and admission for this acute, potentially life threatening condition. Pt is fatigued-appearing but HDS and nontoxic  MDM:    Causes of seizures include:  Intracranial hemorrhage, head injury or TBI, or underlying epilepsy, vascular malformation such as AVM, acute hydrocephalus. Given chronicity of symptoms, lower c/f alcohol/drug withdrawal, toxic ingestion. Considered also electrolyte abnormalities (hyponatremia/hypernatremia, hypomagnesemia, hypocalcemia), hypoglycemia, uremia, non-epileptic seizure. Consider other causes of patient's symptoms, including brain tumor, depression/anxiety. He reportedly has had normal MRI since these symptoms began, however, and he has no FNDs now with normal  mental status. Will give 40 mg/kg of keppra IV as a loading dose for 4 seizures today and discuss with neurology. Patient requests a refill of his home Klonopin, as he reports significant anxiety about the symptoms he is having.    Clinical Course as of 03/30/23 1358  Sun Mar 25, 2023  1155 Glucose-Capillary(!): 144 [HN]  1228 WBC(!): 11.5 Mild leukocytosis [HN]  1405 CT Head Wo Contrast NAICP [HN]  1405 Consulted to neurology [HN]  1432 D/w Dr. Amada Jupiter with neurology. Recommends restarting keppra 500 mg BID as lamictal is not therapeutic for weeks after initiation. Can continue that and be on keppra to cover him while titrating up on lamotrigine. Recommend referral to neurology and ambulatory EEG.  [HN]    Clinical Course User Index [HN] Loetta Rough, MD    Labs: I Ordered, and personally interpreted labs.  The pertinent results include:  those listed  above. Electrolytes unremarkable. No hypoglycemia/hyperglycemia.   Imaging Studies ordered: I ordered imaging studies including CTH I independently visualized and interpreted imaging. I agree with the radiologist interpretation  Additional history obtained from chart review, wife at bedside.    Cardiac Monitoring: The patient was maintained on a cardiac monitor.  I personally viewed and interpreted the cardiac monitored which showed an underlying rhythm of: NSR  Reevaluation: After the interventions noted above, I reevaluated the patient and found that they have :improved  Social Determinants of Health: Lives independently  Disposition: Patient states he feels improved after Keppra infusion and will be observed in the emergency department for a couple more hours per recommendation by neurology after the Keppra infusion to ensure no further seizure-like activity. No reversible causes of seizure are identified here. On prior evaluation, he hasn't had any epileptic episodes while on EEG, would benefit from ambulatory EEG. He is referred again to [facility] and instructed to f/u.  Keppra 500 mg BID is prescribed and klonopin are refilled. Will be safe for discharge after period of observation and UA/UDS results. Signed out to oncoming physician Dr. Rush Landmark.   Co morbidities that complicate the patient evaluation  Past Medical History:  Diagnosis Date   Anxiety    Depression    GERD (gastroesophageal reflux disease)    Seizures (HCC)      Medicines Meds ordered this encounter  Medications   levETIRAcetam (KEPPRA) 3,500 mg in sodium chloride 0.9 % 250 mL IVPB    I have reviewed the patients home medicines and have made adjustments as needed  Problem List / ED Course: Problem List Items Addressed This Visit       Other   Seizure-like activity [facility]) - Primary   Relevant Orders   Ambulatory referral to Neurology                This note was created using  dictation software, which may contain spelling or grammatical errors.    Loetta Rough, MD 03/30/23 [phone]

## 2023-03-25 NOTE — Discharge Instructions (Signed)
Thank you for coming to [facility] Emergency Department. You were seen for seizure-like activity. We did an exam, labs, and imaging, and these showed no acute findings.  Please follow up with [facility] Neurology in the next 1-2 weeks. A referral has been placed to neurology and they will call you to help make an appointment. They will likely want to schedule an ambulatory EEG.  We have prescribed Keppra 500 mg twice per day.   Please continue taking your Lamictal as well.  Do not hesitate to return to the ED or call 911 if you experience: -Worsening symptoms -Severe headache -Asymmetric weakness, numbness/tingling, facial droop, slurred speech -Lightheadedness, passing out -Fevers/chills -Anything else that concerns you

## 2023-03-25 NOTE — ED Provider Notes (Signed)
3:11 PM Care assumed from Dr. Jearld Fenton.  At time of transfer of care, patient is waiting for results of urinalysis prior to likely discharge after having a seizure today.  Patient was given Keppra and had Keppra prescription ordered as well as refill of his Klonopin.  Neurology felt patient was safe for discharge home per previous team once urinalysis is back.  Anticipate discharge.  4:47 PM Urinalysis returned without evidence of infection per previous plan, will discharge.  Family agrees.  Patient discharged in good condition after more stability over 5 hours of observation without recurrent episodes.     Clinical Impression: 1. Seizure-like activity (HCC)     Disposition: Discharge  Condition: Good  I have discussed the results, Dx and Tx plan with the pt(& family if present). He/she/they expressed understanding and agree(s) with the plan. Discharge instructions discussed at great length. Strict return precautions discussed and pt &/or family have verbalized understanding of the instructions. No further questions at time of discharge.    New Prescriptions   LEVETIRACETAM (KEPPRA) 500 MG TABLET    Take 1 tablet (500 mg total) by mouth 2 (two) times daily.    Follow Up: [facility] [address]     [address] [place] [address] [phone] Schedule an appointment as soon as possible for a visit in 2 weeks For follow-up  [facility] at [facility] [address] [place] [address] [phone] Go to  As needed, If symptoms worsen     Apollos Tenbrink, Canary Brim, MD 03/25/23 1650

## 2023-03-27 ENCOUNTER — Telehealth: Payer: Self-pay

## 2023-03-27 ENCOUNTER — Telehealth: Payer: Self-pay | Admitting: Internal Medicine

## 2023-03-27 LAB — LAMOTRIGINE LEVEL: Lamotrigine Lvl: <1 ug/mL — ABNORMAL LOW (ref 2.0–20.0)

## 2023-03-27 NOTE — Telephone Encounter (Signed)
Patient's wife Herbert Seta called and said patient was in ED for multiple seizures in a row. They would like to schedule a virtual visit to discuss next steps and possible disability. They said he is still having the seizures, which is why they would like for the appointment to be virtual. They have an appointment with a seizure specialist next week and would like to know if they can have this appointment this week.  Best callback for Herbert Seta is [phone].

## 2023-03-27 NOTE — Telephone Encounter (Signed)
I have slots available both wed and thurs so not sure why this is coming to me

## 2023-03-27 NOTE — Transitions of Care (Post Inpatient/ED Visit) (Signed)
03/27/2023  Name: [name] [name] MRN: [id number] DOB: May 19, 1976  Today's TOC FU Call Status: Today's TOC FU Call Status:: Successful TOC FU Call Completed TOC FU Call Complete Date: 03/27/23 Patient's Name and Date of Birth confirmed.  Transition Care Management Follow-up Telephone Call Date of Discharge: 03/25/23 Discharge Facility: [facility] Type of Discharge: Emergency Department Reason for ED Visit: Other: (Seizure's) How have you been since you were released from the hospital?: Better Any questions or concerns?: Yes Patient Questions/Concerns:: Patient and his wife will discuss their questions and concerns at the appointment. Patient Questions/Concerns Addressed: Notified Provider of Patient Questions/Concerns, Other:  Items Reviewed: Did you receive and understand the discharge instructions provided?: Yes Medications obtained,verified, and reconciled?: Yes (Medications Reviewed) Any new allergies since your discharge?: No Dietary orders reviewed?: NA Do you have support at home?: Yes People in Home: spouse Name of Support/Comfort Primary Source: Heather  Medications Reviewed Today: Medications Reviewed Today   Medications were not reviewed in this encounter     Home Care and Equipment/Supplies: Were Home Health Services Ordered?: No Any new equipment or medical supplies ordered?: No  Functional Questionnaire: Do you need assistance with bathing/showering or dressing?: No Do you need assistance with meal preparation?: No Do you need assistance with eating?: No Do you have difficulty maintaining continence: No Do you need assistance with getting out of bed/getting out of a chair/moving?: No Do you have difficulty managing or taking your medications?: No  Follow up appointments reviewed: PCP Follow-up appointment confirmed?: Yes Date of PCP follow-up appointment?: 03/29/23 Follow-up Provider: Dr. [facility] Follow-up appointment  confirmed?: Yes Date of Specialist follow-up appointment?: 04/05/23 Follow-Up Specialty Provider:: Dr.Camara Do you need transportation to your follow-up appointment?: No Do you understand care options if your condition(s) worsen?: Yes-patient verbalized understanding    SIGNATURE: Reather Laurence, RMA

## 2023-03-29 ENCOUNTER — Telehealth: Payer: BC Managed Care – PPO | Admitting: Internal Medicine

## 2023-03-29 ENCOUNTER — Encounter: Payer: Self-pay | Admitting: Internal Medicine

## 2023-03-29 DIAGNOSIS — R569 Unspecified convulsions: Secondary | ICD-10-CM

## 2023-03-29 DIAGNOSIS — F39 Unspecified mood [affective] disorder: Secondary | ICD-10-CM

## 2023-03-29 NOTE — Progress Notes (Signed)
Virtual Visit via Video Note  I connected with [name] [name] on 03/29/23 at 10:00 AM EDT by a video enabled telemedicine application and verified that I am speaking with the correct person using two identifiers.  The patient and the provider were at separate locations throughout the entire encounter. Patient location: home, Provider location: work   I discussed the limitations of evaluation and management by telemedicine and the availability of in person appointments. The patient expressed understanding and agreed to proceed. The patient and the provider were the only parties present for the visit unless noted in HPI below.  History of Present Illness: The patient is a 47 y.o. man with visit for ER follow up for multiple seizure like episodes. He has been having some absence type episodes since being home. Still decreased concentration and forgetful since then. Seeing neurology next week and ER started keppra 500 mg BID and he is still taking lamictal 25 mg daily (started about 3-4 days prior to seizures this weekend). He is concerned about employment and may need to take disability of some kind.  Observations/Objective: Appearance: forgetful, breathing appears normal, casual grooming, abdomen does not appear distended, mental status is A and O times 2  Assessment and Plan: See problem oriented charting  Follow Up Instructions: Keep upcoming visit with neurology. For STD we can fill out those for them. I would wait until there is a clearer picture from neurology in terms of if pusuit of LTD is needed.   Visit time 20 minutes in face to face communication with patient and coordination of care, additional 10 minutes spent in record review, coordination or care, ordering tests, communicating/referring to other healthcare professionals, documenting in medical records all on the same day of the visit for total time 30 minutes spent on the visit.   I discussed the assessment and treatment plan with  the patient. The patient was provided an opportunity to ask questions and all were answered. The patient agreed with the plan and demonstrated an understanding of the instructions.   The patient was advised to call back or seek an in-person evaluation if the symptoms worsen or if the condition fails to improve as anticipated.  Myrlene Broker, MD

## 2023-03-29 NOTE — Assessment & Plan Note (Signed)
Recently started lamictal and has not seen benefit yet but too early to see.

## 2023-03-29 NOTE — Assessment & Plan Note (Signed)
Taking keppra 500 mg BID and lamictal 25 mg daily. He is still having staring episodes although slightly less often after starting keppra. He is using clonazepam at least daily for anxiety over all this and that makes him a bit spaced out. He has cut back on marijuana gradually.

## 2023-04-05 ENCOUNTER — Ambulatory Visit (INDEPENDENT_AMBULATORY_CARE_PROVIDER_SITE_OTHER): Payer: BLUE CROSS/BLUE SHIELD | Admitting: Neurology

## 2023-04-05 ENCOUNTER — Encounter: Payer: Self-pay | Admitting: Neurology

## 2023-04-05 VITALS — BP 117/83 | HR 81 | Ht 69.0 in | Wt 198.5 lb

## 2023-04-05 DIAGNOSIS — R569 Unspecified convulsions: Secondary | ICD-10-CM

## 2023-04-05 MED ORDER — LAMOTRIGINE 25 MG PO TABS: ORAL_TABLET | ORAL | 0 refills | Status: DC

## 2023-04-05 MED ORDER — LEVETIRACETAM 500 MG PO TABS: 500.0000 mg | ORAL_TABLET | Freq: Two times a day (BID) | ORAL | 0 refills | Status: DC

## 2023-04-05 NOTE — Progress Notes (Signed)
[facility]  PATIENT: [name] [name] DOB: 1976-07-02  REQUESTING CLINICIAN: Loetta Rough, MD HISTORY FROM: Patient, spouse and chart review  REASON FOR VISIT: New onset seizure/Seizure like activity    HISTORICAL  CHIEF COMPLAINT:  Chief Complaint  Patient presents with   New Patient (Initial Visit)    Rm 12. Patient with wife, sz with convulsions since February, some of them seem to be more focal than focal and following them he will be very cold and then sleep for hours. Convulsions sent him to hospital two weeks ago where he had two in the ER and was given Keppra. Only took keppra in February and March and then stopped following EEG being normal and started back after the ER    HISTORY OF PRESENT ILLNESS:  This is a 47 year old gentleman past medical history anxiety/depression who is presenting with new onset seizure/seizure like activity.  Patient denies any previous history of seizure.  He reports back in February he was in [place] for a concert, this was on February 25.  He reports that he was not feeling well the whole day.  Wife tells [place] later that day he did fall to the ground and had a generalized convulsion.  She reports that patient called her name, when she turned around he was very stiff and rigid and fell face forward to the ground and has generalized tonic-clonic activity.  EMS was called.  Patient was talking with EMS when he had 2 additional seizures.  He was taken to the hospital then released later.  He reports the next day he felt very sleepy.  On their way back from [place] to [place] he did have another seizure when they stop to get some food.  Wife had to drive him to a local hospital where he was admitted for 5 days.  He did have workup including MRI, EEGs and they are all normal.  Patient was started on Keppra and discharged home.  On arrival to [place], patient was having additional episode of staring off, memory problem  then he was admitted to [facility] for total of 5 days.  At that time again he also have CT head and EEG for 5 days and they were all normal no seizure captured and no epileptiform discharges.  At that time Keppra was discontinue due to side effects.  He continues to follow-up with neurology, has additional breakthrough seizures, the generalized tonic-clonic and the staring episode.  Keppra restarted at 500 mg twice daily. Per wife his last generalized convulsion was on September 22 and the last staring episode was last week.  With the seizures he did have bump in bruises but no urinary incontinence no tongue biting. He is also complaining of irritability and memory loss.    Handedness: Right handed  Onset: February 2024  Seizure Type: Generalized convulsion, #2 staring, unresponsiveness  Current frequency: Weekly. Last convulsion September 22 and Last staring episode last week  Any injuries from seizures: Falls and bruises   Seizure risk factors: TBI   Previous ASMs: Keppra   Currenty ASMs: Keppra 500 mg twice daily, Lamotrigine 25 mg daily   ASMs side effects: Denies   Brain Images: Normal   Previous EEGs: Normal    OTHER MEDICAL CONDITIONS: None reported   REVIEW OF SYSTEMS: Full 14 system review of systems performed and negative with exception of: As noted in the HPI   ALLERGIES: Allergies  Allergen Reactions   Hydrocodone     Hyper  HOME MEDICATIONS: Outpatient Medications Prior to Visit  Medication Sig Dispense Refill   acetaminophen-codeine (TYLENOL #3) 300-30 MG tablet Take 1 tablet by mouth every 4 (four) hours as needed.     clonazePAM (KLONOPIN) 0.5 MG tablet Take 1 tablet (0.5 mg total) by mouth 3 (three) times daily as needed for up to 15 days (GTC seizure lasting over 2 minutes. Do not use more than once in 24 hours). 20 tablet 0   Multiple Vitamin (MULTIVITAMIN) tablet Take 1 tablet by mouth daily.     NAYZILAM 5 MG/0.1ML SOLN PLEASE SEE ATTACHED FOR  DETAILED DIRECTIONS     zolpidem (AMBIEN) 10 MG tablet TAKE 1 TABLET BY MOUTH AT BEDTIME AS NEEDED FOR SLEEP.INS ALLOWS 15TAB/25 DAYS (Patient taking differently: Take 5 mg by mouth at bedtime as needed for sleep. Patient only take one-half tablet) 15 tablet 5   lamoTRIgine (LAMICTAL) 25 MG tablet Take 1 tablet (25 mg total) by mouth daily. 30 tablet 0   levETIRAcetam (KEPPRA) 500 MG tablet Take 1 tablet (500 mg total) by mouth 2 (two) times daily. 60 tablet 0   No facility-administered medications prior to visit.    PAST MEDICAL HISTORY: Past Medical History:  Diagnosis Date   Anxiety    Depression    GERD (gastroesophageal reflux disease)    Seizures (HCC)     PAST SURGICAL HISTORY: Past Surgical History:  Procedure Laterality Date   FOOT SURGERY Left 1997   repaired tendon    FAMILY HISTORY: Family History  Problem Relation Age of Onset   Stroke Mother    Hypertension Mother    Arthritis Mother    Heart disease Brother    Stroke Maternal Grandmother    Stroke Paternal Grandmother     SOCIAL HISTORY: Social History   Socioeconomic History   Marital status: Married    Spouse name: Not on file   Number of children: Not on file   Years of education: Not on file   Highest education level: Not on file  Occupational History   Not on file  Tobacco Use   Smoking status: Former    Current packs/day: 0.00    Types: Cigarettes    Quit date: 07/03/2008    Years since quitting: 14.7    Passive exposure: Past   Smokeless tobacco: Never  Substance and Sexual Activity   Alcohol use: Yes    Comment: social   Drug use: No   Sexual activity: Not on file  Other Topics Concern   Not on file  Social History Narrative   Not on file   Social Determinants of Health   Financial Resource Strain: Not on file  Food Insecurity: No Food Insecurity (09/08/2022)   Hunger Vital Sign    Worried About Running Out of Food in the Last Year: Never true    Ran Out of Food in the Last  Year: Never true  Transportation Needs: No Transportation Needs (09/08/2022)   PRAPARE - Administrator, Civil Service (Medical): No    Lack of Transportation (Non-Medical): No  Physical Activity: Not on file  Stress: Not on file  Social Connections: Unknown (11/15/2021)   [facility]   Social Network    Social Network: Not on file  Intimate Partner Violence: Not At Risk (09/04/2022)   Humiliation, Afraid, Rape, and Kick questionnaire    Fear of Current or Ex-Partner: No    Emotionally Abused: No    Physically Abused: No  Sexually Abused: No    PHYSICAL EXAM  GENERAL EXAM/CONSTITUTIONAL: Vitals:  Vitals:   04/05/23 1014  BP: 117/83  Pulse: 81  Weight: 198 lb 8 oz (90 kg)  Height: 5\' 9"  (1.753 m)   Body mass index is 29.31 kg/m. Wt Readings from Last 3 Encounters:  04/05/23 198 lb 8 oz (90 kg)  03/25/23 195 lb 1.7 oz (88.5 kg)  02/21/23 195 lb (88.5 kg)   Patient is in no distress; well developed, nourished and groomed; neck is supple  MUSCULOSKELETAL: Gait, strength, tone, movements noted in Neurologic exam below  NEUROLOGIC: MENTAL STATUS:      No data to display         awake, alert, oriented to person, place and time recent and remote memory intact normal attention and concentration language fluent, comprehension intact, naming intact fund of knowledge appropriate  CRANIAL NERVE:  2nd, 3rd, 4th, 6th - Visual fields full to confrontation, extraocular muscles intact, no nystagmus 5th - facial sensation symmetric 7th - facial strength symmetric 8th - hearing intact 9th - palate elevates symmetrically, uvula midline 11th - shoulder shrug symmetric 12th - tongue protrusion midline  MOTOR:  normal bulk and tone, full strength in the BUE, BLE  SENSORY:  normal and symmetric to light touch  COORDINATION:  finger-nose-finger, fine finger movements normal  GAIT/STATION:  normal    DIAGNOSTIC DATA (LABS,  IMAGING, TESTING) - I reviewed patient records, labs, notes, testing and imaging myself where available.  Lab Results  Component Value Date   WBC 11.5 (H) 03/25/2023   HGB 15.8 03/25/2023   HCT 47.0 03/25/2023   MCV 89.7 03/25/2023   PLT 226 03/25/2023      Component Value Date/Time   NA 136 03/25/2023 1150   K 4.1 03/25/2023 1150   CL 104 03/25/2023 1150   CO2 19 (L) 03/25/2023 1150   GLUCOSE 137 (H) 03/25/2023 1150   BUN 11 03/25/2023 1150   CREATININE 1.10 03/25/2023 1150   CALCIUM 9.5 03/25/2023 1150   PROT 6.9 02/23/2023 1216   ALBUMIN 4.2 02/23/2023 1216   AST 21 02/23/2023 1216   ALT 17 02/23/2023 1216   ALKPHOS 68 02/23/2023 1216   BILITOT 0.4 02/23/2023 1216   GFRNONAA >60 03/25/2023 1150   GFRAA >90 03/31/2013 0832   Lab Results  Component Value Date   CHOL 179 09/04/2022   HDL 36 (L) 09/04/2022   LDLCALC 106 (H) 09/04/2022   LDLDIRECT 122.0 08/10/2021   TRIG 184 (H) 09/04/2022   Lab Results  Component Value Date   HGBA1C 5.8 08/10/2021   Lab Results  Component Value Date   VITAMINB12 330 08/10/2021   Lab Results  Component Value Date   TSH 3.707 09/04/2022    MRI Brain 10/02/2022 1. Unremarkable MRI appearance of the brain. No evidence of an acute intracranial abnormality. 2. No specific seizure focus is identified. 3. Mild mucosal thickening within the right maxillary sinus   LTM 09/05/2022 This study is within normal limits. No seizures or epileptiform discharges were seen throughout the recording. Event button was pressed on 09/04/2022 at 1841. Patient states he is feeling tired. Concomitant EEG showed normal posterior dominant rhythm. This was most likely NOT an epileptic event.     ASSESSMENT AND PLAN  47 y.o. year old male  with history of anxiety/depression who is complaining with new onset seizure-like activity described as generalized convulsions and also staring episodes.  He did have a full workup including CT head, MRIs, multiple EEGs  and they are all normal, no epileptiform discharges on EEG after his Keppra was held for 72 hours.  Currently is on Keppra 500 mg twice daily and lamotrigine 25 mg daily but seems to have side effect of the Keppra including irritability.  Plan will be to increase the lamotrigine to a goal between 150 to 200 mg twice daily and to discontinue Keppra.  I gave him a titration schedule and patient will come back in 6 weeks at that time we will obtain level and adjust his medication accordingly.  Advised wife to contact me if you continue to have events.   1. Seizure-like activity (HCC)     Patient Instructions  Continue with Keppra 500 mg twice daily Increase lamotrigine as below Week 1: 25mg  (one pill) twice daily Week 2: 50mg  (two pills) twice daily  Week 3: 75mg  (three pills) twice daily Week 4: 100mg  (fours pills) twice daily Week 5: 150 mg (6 tablets) twice daily  Week 6: 200 mg twice (8 tablets) twice daily  Will obtain a Lamotrigine level (blood test) and complete metabolic panel after week 6 and adjust the dose accordingly  Please stop the medication and call the office as soon as you develop a new rash     Per [facility] statutes, patients with seizures are not allowed to drive until they have been seizure-free for six months.  Other recommendations include using caution when using heavy equipment or power tools. Avoid working on ladders or at heights. Take showers instead of baths.  Do not swim alone.  Ensure the water temperature is not too high on the home water heater. Do not go swimming alone. Do not lock yourself in a room alone (i.e. bathroom). When caring for infants or small children, sit down when holding, feeding, or changing them to minimize risk of injury to the child in the event you have a seizure. Maintain good sleep hygiene. Avoid alcohol.  Also recommend adequate sleep, hydration, good diet and minimize stress.   During the Seizure  - First, ensure adequate  ventilation and place patients on the floor on their left side  Loosen clothing around the neck and ensure the airway is patent. If the patient is clenching the teeth, do not force the mouth open with any object as this can cause severe damage - Remove all items from the surrounding that can be hazardous. The patient may be oblivious to what's happening and may not even know what he or she is doing. If the patient is confused and wandering, either gently guide him/her away and block access to outside areas - Reassure the individual and be comforting - Call 911. In most cases, the seizure ends before EMS arrives. However, there are cases when seizures may last over 3 to 5 minutes. Or the individual may have developed breathing difficulties or severe injuries. If a pregnant patient or a person with diabetes develops a seizure, it is prudent to call an ambulance. - Finally, if the patient does not regain full consciousness, then call EMS. Most patients will remain confused for about 45 to 90 minutes after a seizure, so you must use judgment in calling for help. - Avoid restraints but make sure the patient is in a bed with padded side rails - Place the individual in a lateral position with the neck slightly flexed; this will help the saliva drain from the mouth and prevent the tongue from falling backward - Remove all nearby furniture and other hazards from the  area - Provide verbal assurance as the individual is regaining consciousness - Provide the patient with privacy if possible - Call for help and start treatment as ordered by the caregiver   After the Seizure (Postictal Stage)  After a seizure, most patients experience confusion, fatigue, muscle pain and/or a headache. Thus, one should permit the individual to sleep. For the next few days, reassurance is essential. Being calm and helping reorient the person is also of importance.  Most seizures are painless and end spontaneously. Seizures are not  harmful to others but can lead to complications such as stress on the lungs, brain and the heart. Individuals with prior lung problems may develop labored breathing and respiratory distress.     No orders of the defined types were placed in this encounter.   Meds ordered this encounter  Medications   lamoTRIgine (LAMICTAL) 25 MG tablet    Sig: Take 1 tablet (25 mg total) by mouth 2 (two) times daily for 7 days, THEN 2 tablets (50 mg total) 2 (two) times daily for 7 days, THEN 3 tablets (75 mg total) 2 (two) times daily for 7 days, THEN 4 tablets (100 mg total) 2 (two) times daily for 7 days, THEN 6 tablets (150 mg total) 2 (two) times daily for 7 days, THEN 8 tablets (200 mg total) 2 (two) times daily for 7 days.    Dispense:  240 tablet    Refill:  0   levETIRAcetam (KEPPRA) 500 MG tablet    Sig: Take 1 tablet (500 mg total) by mouth 2 (two) times daily.    Dispense:  60 tablet    Refill:  0    Return in about 6 weeks (around 05/17/2023).    Windell Norfolk, MD 04/06/2023, 8:13 AM  [facility] [address] [place], [place] [address] [phone]

## 2023-04-06 NOTE — Patient Instructions (Signed)
Continue with Keppra 500 mg twice daily Increase lamotrigine as below Week 1: 25mg  (one pill) twice daily Week 2: 50mg  (two pills) twice daily  Week 3: 75mg  (three pills) twice daily Week 4: 100mg  (fours pills) twice daily Week 5: 150 mg (6 tablets) twice daily  Week 6: 200 mg twice (8 tablets) twice daily  Will obtain a Lamotrigine level (blood test) and complete metabolic panel after week 6 and adjust the dose accordingly  Please stop the medication and call the office as soon as you develop a new rash

## 2023-04-16 ENCOUNTER — Telehealth: Payer: Self-pay | Admitting: Neurology

## 2023-04-16 NOTE — Telephone Encounter (Signed)
No, I will not take over the Klonopin. I am starting him on Lamotrigine and I would advise him to stop the Klonopin.

## 2023-04-16 NOTE — Telephone Encounter (Signed)
  This was in last office note from previous provider, please advise on whether you want to take over or not

## 2023-04-16 NOTE — Telephone Encounter (Signed)
Pt said, would like Dr. Teresa Coombs take over prescription clonazePAM (KLONOPIN) 0.5 MG tablet (Expired). Would like a call from the nurse.

## 2023-04-17 ENCOUNTER — Other Ambulatory Visit: Payer: Self-pay | Admitting: Internal Medicine

## 2023-04-17 ENCOUNTER — Telehealth: Payer: Self-pay | Admitting: Neurology

## 2023-04-17 ENCOUNTER — Other Ambulatory Visit: Payer: Self-pay | Admitting: Neurology

## 2023-04-17 MED ORDER — CITALOPRAM HYDROBROMIDE 20 MG PO TABS: 20.0000 mg | ORAL_TABLET | Freq: Every day | ORAL | 6 refills | Status: DC

## 2023-04-17 NOTE — Telephone Encounter (Signed)
Called and spoke to wife heather on dpr, who is concerned about husband and his fear of having another seizure. He is afriad to leave the house because he doesn't want to "embarrass his family" if he has a seizure in public somewhere. He has been very diligent about his dosing and medication. He is currently on the 3 tab titration and still taking the keppra as prescribed. She reports he will tell her he feels like he is gonna have a seizure and will sit down but the seizure never comes. She understands that klonopin is not being taken over but would like to know if anything else can be prescribed for his nerves.

## 2023-04-17 NOTE — Telephone Encounter (Signed)
Pt's wife called needing to speak to RN to possibly put pt on anxiety medications as discussed with MD prior. Pt is having anxiety and fear of having seizures and getting hurt. Please advise.

## 2023-04-17 NOTE — Telephone Encounter (Signed)
We can give him Celexa

## 2023-04-18 ENCOUNTER — Telehealth: Payer: Self-pay

## 2023-04-18 ENCOUNTER — Other Ambulatory Visit: Payer: Self-pay | Admitting: Neurology

## 2023-04-18 DIAGNOSIS — R4189 Other symptoms and signs involving cognitive functions and awareness: Secondary | ICD-10-CM

## 2023-04-18 DIAGNOSIS — R569 Unspecified convulsions: Secondary | ICD-10-CM

## 2023-04-18 NOTE — Telephone Encounter (Signed)
Wife called back and we reviewed celexa medication and wife verbalized understanding. Discussed with wife support groups for TBI survivors and family. She inquires on disability and I advised I would ask Dr. Teresa Coombs about neurophysch  and cognitive testing. Wife was appreciative of call and support. She is okay with my chart message.

## 2023-04-18 NOTE — Telephone Encounter (Signed)
Mychart message sent.

## 2023-04-18 NOTE — Telephone Encounter (Signed)
Done. Thanks.

## 2023-04-23 ENCOUNTER — Telehealth: Payer: Self-pay | Admitting: Neurology

## 2023-04-23 NOTE — Telephone Encounter (Signed)
Referral for neuropsychology fax to [facility]. Phone:[phone], Fax: [phone]

## 2023-04-24 ENCOUNTER — Other Ambulatory Visit: Payer: Self-pay | Admitting: Internal Medicine

## 2023-04-27 ENCOUNTER — Other Ambulatory Visit: Payer: Self-pay | Admitting: Neurology

## 2023-04-30 ENCOUNTER — Other Ambulatory Visit: Payer: Self-pay | Admitting: Neurology

## 2023-04-30 DIAGNOSIS — R569 Unspecified convulsions: Secondary | ICD-10-CM

## 2023-04-30 MED ORDER — LAMOTRIGINE 200 MG PO TBDP: 200.0000 mg | ORAL_TABLET | Freq: Two times a day (BID) | ORAL | 3 refills | Status: DC

## 2023-04-30 NOTE — Telephone Encounter (Signed)
Should pt be on 200mg  based on the up taper from last rx?  THEN 8 tablets (200 mg total) 2 (two) times daily for 7 days.

## 2023-04-30 NOTE — Telephone Encounter (Signed)
New Rx for Lamotrigine sent to patient. Please advise him to stop by for blood work. Thanks

## 2023-05-04 ENCOUNTER — Other Ambulatory Visit: Payer: Self-pay | Admitting: Internal Medicine

## 2023-05-10 ENCOUNTER — Other Ambulatory Visit: Payer: Self-pay | Admitting: Neurology

## 2023-05-21 ENCOUNTER — Encounter: Payer: Self-pay | Admitting: Neurology

## 2023-05-21 ENCOUNTER — Ambulatory Visit (INDEPENDENT_AMBULATORY_CARE_PROVIDER_SITE_OTHER): Payer: BC Managed Care – PPO | Admitting: Neurology

## 2023-05-21 VITALS — BP 130/80 | Ht 69.0 in | Wt 199.0 lb

## 2023-05-21 DIAGNOSIS — R4189 Other symptoms and signs involving cognitive functions and awareness: Secondary | ICD-10-CM | POA: Diagnosis not present

## 2023-05-21 DIAGNOSIS — Z5181 Encounter for therapeutic drug level monitoring: Secondary | ICD-10-CM | POA: Diagnosis not present

## 2023-05-21 DIAGNOSIS — R569 Unspecified convulsions: Secondary | ICD-10-CM | POA: Diagnosis not present

## 2023-05-21 MED ORDER — LEVETIRACETAM 500 MG PO TABS: 500.0000 mg | ORAL_TABLET | Freq: Every day | ORAL | 0 refills | Status: DC

## 2023-05-21 MED ORDER — LAMOTRIGINE 200 MG PO TBDP: 300.0000 mg | ORAL_TABLET | Freq: Two times a day (BID) | ORAL | 0 refills | Status: DC
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
## 2023-05-21 NOTE — Patient Instructions (Signed)
Decrease Keppra to 500 mg daily for 1 month then stop the medication Will check lamotrigine level today, then increase to 300 mg twice daily 3 days ambulatory EEG Continue to follow with PCP and return in 3 months or sooner if worse.

## 2023-05-22 DIAGNOSIS — Z0271 Encounter for disability determination: Secondary | ICD-10-CM

## 2023-05-23 LAB — COMPREHENSIVE METABOLIC PANEL
ALT: 16 IU/L (ref 0–44)
AST: 22 IU/L (ref 0–40)
Albumin: 4.7 g/dL (ref 4.1–5.1)
Alkaline Phosphatase: 98 IU/L (ref 44–121)
BUN/Creatinine Ratio: 16 (ref 9–20)
BUN: 15 mg/dL (ref 6–24)
Bilirubin Total: 0.2 mg/dL (ref 0.0–1.2)
CO2: 25 mmol/L (ref 20–29)
Calcium: 9.9 mg/dL (ref 8.7–10.2)
Chloride: 102 mmol/L (ref 96–106)
Creatinine, Ser: 0.96 mg/dL (ref 0.76–1.27)
Globulin, Total: 2.2 g/dL (ref 1.5–4.5)
Glucose: 99 mg/dL (ref 70–99)
Potassium: 4.1 mmol/L (ref 3.5–5.2)
Sodium: 143 mmol/L (ref 134–144)
Total Protein: 6.9 g/dL (ref 6.0–8.5)
eGFR: 98 mL/min/{1.73_m2} (ref >59)

## 2023-05-23 LAB — LAMOTRIGINE LEVEL: Lamotrigine Lvl: 6 ug/mL (ref 2.0–20.0)

## 2023-05-28 ENCOUNTER — Telehealth: Payer: Self-pay

## 2023-05-28 NOTE — Telephone Encounter (Signed)
Faxed eeg 72 hr request to [facility]

## 2023-06-04 ENCOUNTER — Other Ambulatory Visit: Payer: Self-pay | Admitting: Internal Medicine

## 2023-06-05 NOTE — Telephone Encounter (Signed)
It looks like neurology advised him to stop this in October so should not be filled.

## 2023-06-13 ENCOUNTER — Telehealth: Payer: Self-pay | Admitting: Neurology

## 2023-06-13 MED ORDER — CLONAZEPAM 0.5 MG PO TABS: ORAL_TABLET | ORAL | 0 refills | Status: DC

## 2023-06-13 NOTE — Telephone Encounter (Signed)
Return call to patient, he reports while decreasing the Keppra he has had 2 additional staring off episodes, the last one happening Monday. He reports taking 500 mg Keppra 1x daily for the last and only has 4 tablets left. He also has been only taking 200 mg lamictal because pharmacy was filling wrong prescription. He is also having increased anxiety going into the holidays and wants to know if Dr. Teresa Coombs can refill his klonopin. Advised I will send to Dr. Teresa Coombs for review. Patient appreciative of call. He denies SI/HI.     I call [facility] and spoke with Madsion and she  cancelled all previous scripts for keppra and Lamictal except for the current prescriptions.

## 2023-06-13 NOTE — Telephone Encounter (Signed)
Pt called needing to speak to the MD regarding his Keppra and how he is having more anxiety and how he had a stare off seizure the other day. Pt states that he was advised to decrease his Keppra but is wanting to know if he can get his clonazePAM (KLONOPIN) 0.5 MG tablet refilled.

## 2023-06-16 ENCOUNTER — Other Ambulatory Visit: Payer: Self-pay | Admitting: Neurology

## 2023-06-20 DIAGNOSIS — Z0271 Encounter for disability determination: Secondary | ICD-10-CM

## 2023-07-13 DIAGNOSIS — R569 Unspecified convulsions: Secondary | ICD-10-CM | POA: Diagnosis not present

## 2023-07-19 ENCOUNTER — Other Ambulatory Visit: Payer: Self-pay | Admitting: Internal Medicine

## 2023-07-19 ENCOUNTER — Other Ambulatory Visit: Payer: Self-pay | Admitting: Neurology

## 2023-07-19 NOTE — Telephone Encounter (Signed)
Last seen 05/21/23, next appt scheduled2/28/25 Dispenses    Dispensed Days Supply Quantity Provider Pharmacy  CLONAZEPAM 0.5 MG TABLET 06/13/2023 30 30 each Windell Norfolk, MD [facility] [phone] - [place]...  CLONAZEPAM 0.5 MG TABLET 04/25/2023 30 30 each Pincus Sanes, MD [facility] [phone] - [place]...  CLONAZEPAM 0.5 MG TABLET 03/25/2023 7 20 each Loetta Rough, MD [facility] [phone] - O...  CLONAZEPAM 0.5 MG TABLET 12/21/2022 30 30 each Myrlene Broker, MD [facility] [phone] - [place]...  CLONAZEPAM 0.5 MG TABLET 10/13/2022 30 30 each Myrlene Broker, MD [facility] [phone] - [facility]...  clonazePAM (KLONOPIN) 1 MG tablet 09/07/2022 5 5 tablet Charlsie Quest, MD [facility].Marland KitchenMarland Kitchen

## 2023-07-28 ENCOUNTER — Encounter: Payer: Self-pay | Admitting: Neurology

## 2023-07-28 ENCOUNTER — Other Ambulatory Visit: Payer: Self-pay | Admitting: Neurology

## 2023-07-28 DIAGNOSIS — R569 Unspecified convulsions: Secondary | ICD-10-CM

## 2023-07-28 NOTE — Procedures (Signed)
Clinical History:  48 year old male with complaints of seizures. He is currently taking lamotrigine 200 mg twice daily and 500 mg twice daily of Keppra. Patient reports that he is still having seizures, on average once or twice per week. Both patient and wife report that the seizure frequency and severity have markedly improved since the addition of lamotrigine. With his seizures, he starts with having a frown on his face, then mouth automatism followed by slurred speech. His wife tells me that during this time he is responsive, but just has slow speech. Before seizures he will have an upset stomach, no vomiting, and afterwards he feels very tired and will have to lie down. He has not had any generalized convulsions. He reports that his memory is not as good as previously.   INTERMITTENT MONITORING with VIDEO TECHNICAL SUMMARY: This AVEEG was performed using equipment provided by [employer] utilizing Bluetooth ( [employer] ) amplifiers with continuous EEGT attended video collection using encrypted remote transmission via [employer] secured cellular tower network with data rates for each AVEEG performed. This is a Therapist, music AVEEG, obtained, according to the 10-20 international electrode placement system, reformatted digitally into referential and bipolar montages. Data was acquired with a minimum of 21 bipolar connections and sampled at a minimum rate of 250 cycles per second per channel, maximum rate of 450 cycles per second per channel and two channels for EKG. The entire VEEG study was recorded through cable and or radio telemetry for subsequent analysis. Specified epochs of the AVEEG data were identified at the direction of the subject by the depression of a push button by the patient. Each patients event file included data acquired two minutes prior to the push button activation and continuing until two minutes afterwards. AVEEG files were reviewed on [facility] server,  Licensed Software provided by [employer] with a digital high frequency filter set at 70 Hz and a low frequency filter set at 1 Hz with a paper speed of 72mm/s resulting in 10 seconds per digital page. This entire AVEEG was reviewed by the EEG Technologist. Random time samples, random sleep samples, clips, patient initiated push button files with included patient daily diary logs, EEG Technologist pruned data was reviewed and verified for accuracy and validity by the governing reading neurologist in full details. This AEEGV was fully compliant with all requirements for CPT 97500 for setup, patient education, take down and administered by an EEG technologist.   Long-Term EEG with Video was monitored intermittently by a qualified EEG technologist for the entirety of the recording; quality check-ins were performed at a minimum of every two hours, checking and documenting real-time data and video to assure the integrity and quality of the recording (e.g., camera position, electrode integrity and impedance), and identify the need for maintenance. For intermittent monitoring, an EEG Technologist monitored no more than 12 patients concurrently. Diagnostic video was captured at least 80% of the time during the recording.   PATIENT EVENTS: There were no patient events noted or captured during this recording.   TECHNOLOGIST EVENTS: No clear epileptiform activity was detected by the reviewing neurodiagnostic technologist for further review. TIME SAMPLES: 10-minutes of every 2 hours recorded are reviewed as random time samples.   SLEEP SAMPLES: 5-minutes of every 24 hours recorded are reviewed as random sleep samples.  AWAKE: At maximal level of alertness, the posterior dominant background activity was continuous, reactive, low voltage rhythm of 10-11 Hz. This was symmetric, well-modulated, and attenuated with eye opening. Diffuse, symmetric, frontocentral  beta range activity was present.   SLEEP:  N1 Sleep (Stage 1)  was observed and characterized by the disappearance of alpha rhythm and the appearance of vertex activity.   N2 Sleep (Stage 2) was observed and characterized by vertex waves, K-complexes, and sleep spindles.   N3 (Stage 3) sleep was observed and characterized by high amplitude Delta activity of 20%.   REM sleep was observed.   EKG: There were no arrhythmias or abnormalities noted during this recording.   Impression: This is a normal 72 hours ambulatory video EEG. There were no seizures, events or epileptiform discharges seen during this recording.  Windell Norfolk, MD [facility]

## 2023-07-30 ENCOUNTER — Encounter: Payer: Self-pay | Admitting: Neurology

## 2023-08-05 ENCOUNTER — Other Ambulatory Visit: Payer: Self-pay | Admitting: Internal Medicine

## 2023-08-31 ENCOUNTER — Encounter: Payer: Self-pay | Admitting: Neurology

## 2023-08-31 ENCOUNTER — Ambulatory Visit: Payer: BC Managed Care – PPO | Admitting: Neurology

## 2023-08-31 VITALS — BP 130/87 | HR 64 | Ht 69.0 in | Wt 201.5 lb

## 2023-08-31 DIAGNOSIS — R569 Unspecified convulsions: Secondary | ICD-10-CM | POA: Diagnosis not present

## 2023-08-31 NOTE — Patient Instructions (Signed)
 Continue current medications Refer to psychiatry for evaluation/management of causes of nonepileptic spells Return in 6 months or sooner if worse.
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
## 2023-09-04 ENCOUNTER — Telehealth: Payer: Self-pay | Admitting: Neurology

## 2023-09-04 NOTE — Telephone Encounter (Signed)
 Psychiatry referral faxed to [facility] (fax# [phone], phone#, [phone])

## 2023-09-12 ENCOUNTER — Other Ambulatory Visit: Payer: Self-pay | Admitting: Neurology

## 2023-09-13 NOTE — Telephone Encounter (Signed)
 Last seen 08/31/23 Next appt 02/26/24 Dispenses   Dispensed Days Supply Quantity Provider Pharmacy  CLONAZEPAM 0.5MG  TAB 07/19/2023 30 30 tablet Windell Norfolk, MD [facility] [phone] - O...  CLONAZEPAM 0.5MG  TAB 06/13/2023 30 30 tablet Windell Norfolk, MD [facility] [phone] - O...  CLONAZEPAM 0.5MG  TAB 04/25/2023 30 30 tablet Pincus Sanes, MD [facility] [phone] - O...  CLONAZEPAM 0.5MG  TAB 03/25/2023 7 20 tablet Loetta Rough, MD [facility] [phone] - O...  CLONAZEPAM 0.5 MG TABLET 12/21/2022 30 30 each Myrlene Broker, MD [facility] [phone] - O...  CLONAZEPAM 0.5 MG TABLET 10/13/2022 30 30 each Myrlene Broker, MD [facility] [phone] - O.Marland KitchenMarland Kitchen

## 2023-10-26 ENCOUNTER — Other Ambulatory Visit: Payer: Self-pay | Admitting: Internal Medicine

## 2023-10-26 NOTE — Telephone Encounter (Signed)
 Copied from CRM [id number]. Topic: Clinical - Medication Refill >> Oct 26, 2023  9:12 AM Deaijah H wrote: Most Recent Primary Care Visit:  Provider: Bambi Lever A  Department: [facility]  Visit Type: MYCHART VIDEO VISIT  Date: 03/29/2023  Medication: zolpidem  (AMBIEN ) 10 MG tablet  Has the patient contacted their pharmacy? Yes (Agent: If no, request that the patient contact the pharmacy for the refill. If patient does not wish to contact the pharmacy document the reason why and proceed with request.) (Agent: If yes, when and what did the pharmacy advise?) They sent a request in and haven't heard anything back   Is this the correct pharmacy for this prescription? Yes If no, delete pharmacy and type the correct one.  This is the patient's preferred pharmacy:  [id number] - [place], [place] - [address] [place] [address] Phone: [phone] Fax: [phone]    Has the prescription been filled recently? No  Is the patient out of the medication? No, 2 left  Has the patient been seen for an appointment in the last year OR does the patient have an upcoming appointment? No  Can we respond through MyChart? Yes  Agent: Please be advised that Rx refills may take up to 3 business days. We ask that you follow-up with your pharmacy.

## 2023-10-26 NOTE — Telephone Encounter (Signed)
 Called pharmacy and was advised that patient does not have refills remaining for requested medication.

## 2023-10-29 ENCOUNTER — Telehealth: Payer: Self-pay | Admitting: Internal Medicine

## 2023-10-29 MED ORDER — ZOLPIDEM TARTRATE 10 MG PO TABS: ORAL_TABLET | ORAL | 3 refills | Status: DC

## 2023-10-29 NOTE — Telephone Encounter (Signed)
 Copied from CRM [id number]. Topic: Clinical - Prescription Issue >> Oct 29, 2023 11:01 AM Oddis Bench wrote: Reason for CRM: Patient is calling about prescription for zolpidem  (AMBIEN ) 10 MG tablet, He has an appt setup  for 05/09 but he is almost out of the script and he is worried that if he does not get the sleep he will have a seizure. Checked the notes and it shows that the med has been discountinued.

## 2023-10-30 NOTE — Telephone Encounter (Signed)
 It was refilled yesterday unsure why this came to me

## 2023-11-09 ENCOUNTER — Ambulatory Visit: Admitting: Internal Medicine

## 2023-11-21 ENCOUNTER — Other Ambulatory Visit: Payer: Self-pay | Admitting: Neurology

## 2023-11-22 NOTE — Telephone Encounter (Signed)
 Last seen on 08/31/23 Follow up scheduled on 02/26/24   Dispensed Days Supply Quantity Provider Pharmacy  CLONAZEPAM  0.5 MG TABLET 09/14/2023 30 30 each Cassandra Cleveland, MD [facility] [phone] - O...      Rx pending to be signed

## 2023-11-30 ENCOUNTER — Ambulatory Visit: Admitting: Internal Medicine

## 2023-11-30 ENCOUNTER — Encounter: Payer: Self-pay | Admitting: Internal Medicine

## 2023-11-30 VITALS — BP 120/88 | HR 78 | Temp 98.3°F | Ht 69.0 in | Wt 196.0 lb

## 2023-11-30 DIAGNOSIS — R569 Unspecified convulsions: Secondary | ICD-10-CM

## 2023-11-30 DIAGNOSIS — Z1211 Encounter for screening for malignant neoplasm of colon: Secondary | ICD-10-CM

## 2023-11-30 DIAGNOSIS — F5101 Primary insomnia: Secondary | ICD-10-CM

## 2023-11-30 DIAGNOSIS — R4189 Other symptoms and signs involving cognitive functions and awareness: Secondary | ICD-10-CM | POA: Diagnosis not present

## 2023-11-30 MED ORDER — TRIAMCINOLONE ACETONIDE 0.1 % EX CREA: 1.0000 | TOPICAL_CREAM | Freq: Two times a day (BID) | CUTANEOUS | 0 refills | Status: AC

## 2023-11-30 MED ORDER — ZOLPIDEM TARTRATE 10 MG PO TABS: 10.0000 mg | ORAL_TABLET | Freq: Every day | ORAL | 3 refills | Status: DC

## 2023-11-30 NOTE — Progress Notes (Signed)
   Subjective:   Patient ID: [name] [name], male    DOB: 13-Jun-1976, 48 y.o.   MRN: [id number]  HPI The patient is a 48 YO man coming in for follow up. Struggling a little more with sleep and does have more episodes with sleep deprivation.  Review of Systems  Constitutional: Negative.   HENT: Negative.    Eyes: Negative.   Respiratory:  Negative for cough, chest tightness and shortness of breath.   Cardiovascular:  Negative for chest pain, palpitations and leg swelling.  Gastrointestinal:  Negative for abdominal distention, abdominal pain, constipation, diarrhea, nausea and vomiting.  Musculoskeletal: Negative.   Skin:  Positive for rash.  Neurological: Negative.   Psychiatric/Behavioral:  Positive for sleep disturbance. The patient is nervous/anxious.     Objective:  Physical Exam Constitutional:      Appearance: He is well-developed.  HENT:     Head: Normocephalic and atraumatic.  Cardiovascular:     Rate and Rhythm: Normal rate and regular rhythm.  Pulmonary:     Effort: Pulmonary effort is normal. No respiratory distress.     Breath sounds: Normal breath sounds. No wheezing or rales.  Abdominal:     General: Bowel sounds are normal. There is no distension.     Palpations: Abdomen is soft.     Tenderness: There is no abdominal tenderness. There is no rebound.  Musculoskeletal:     Cervical back: Normal range of motion.  Skin:    General: Skin is warm and dry.  Neurological:     Mental Status: He is alert and oriented to person, place, and time.     Coordination: Coordination normal.     Vitals:   11/30/23 1130  BP: 120/88  Pulse: 78  Temp: 98.3 F (36.8 C)  TempSrc: Oral  SpO2: 97%  Weight: 196 lb (88.9 kg)  Height: 5\' 9"  (1.753 m)    Assessment & Plan:

## 2023-11-30 NOTE — Assessment & Plan Note (Signed)
 Still taking ambien  and sometimes taking 10 mg at bedtime (previously was mostly using 5 mg at bedtime).

## 2023-11-30 NOTE — Assessment & Plan Note (Signed)
 Is seeing neurology and having less episodes with lamotrigine  300 mg BID and was referred to psych as well.

## 2023-11-30 NOTE — Assessment & Plan Note (Signed)
 Somewhat improved but is noticeably worse after an episode.

## 2023-11-30 NOTE — Patient Instructions (Signed)
 We have sent in the cream to use on the tick bites

## 2023-12-25 ENCOUNTER — Ambulatory Visit: Payer: Self-pay | Admitting: Internal Medicine

## 2023-12-25 LAB — COLOGUARD
COLOGUARD: NEGATIVE
Cologuard: NEGATIVE

## 2024-01-02 ENCOUNTER — Telehealth: Payer: Self-pay

## 2024-01-02 ENCOUNTER — Telehealth: Payer: Self-pay | Admitting: Neurology

## 2024-01-02 ENCOUNTER — Other Ambulatory Visit (HOSPITAL_COMMUNITY): Payer: Self-pay

## 2024-01-02 NOTE — Telephone Encounter (Signed)
 Pharmacy Patient Advocate Encounter  Received notification from [employer] that Prior Authorization for lamoTRIgine  200MG  dispersible tablets has been APPROVED from 01/02/2024 to 01/01/2025   PA #/Case ID/Reference #: PA Case ID #: [id number]

## 2024-01-02 NOTE — Telephone Encounter (Signed)
 Pharmacy Patient Advocate Encounter   Received notification from Physician's Office that prior authorization for lamoTRIgine  200MG  dispersible tablets is required/requested.   Insurance verification completed.   The patient is insured through [facility] .   Per test claim: PA required; PA submitted to above mentioned insurance via CoverMyMeds Key/confirmation #/EOC [id number] Status is pending

## 2024-01-02 NOTE — Telephone Encounter (Signed)
 Pt is asking for a call to discuss that under his new insurance he is being told that the form of his lamoTRIgine  200 MG TBDP is not covered under his insurance.  Pt reports he is soon to run out.

## 2024-01-08 ENCOUNTER — Other Ambulatory Visit: Payer: Self-pay | Admitting: Neurology

## 2024-01-08 MED ORDER — LAMOTRIGINE 200 MG PO TBDP
300.0000 mg | ORAL_TABLET | Freq: Two times a day (BID) | ORAL | 0 refills | Status: AC
Start: 2024-01-08 — End: 2025-01-02

## 2024-01-16 ENCOUNTER — Other Ambulatory Visit: Payer: Self-pay | Admitting: Internal Medicine

## 2024-01-31 ENCOUNTER — Other Ambulatory Visit: Payer: Self-pay | Admitting: Internal Medicine

## 2024-02-08 ENCOUNTER — Telehealth: Payer: Self-pay | Admitting: Internal Medicine

## 2024-02-08 NOTE — Telephone Encounter (Signed)
 Copied from CRM [id number]. Topic: Clinical - Medication Question >> Feb 08, 2024 10:05 AM [name] [name] wrote: Reason for CRM: Patient is stating that the tadalafil (CIALIS) 5 MG tablet  is not working as he wanted it to- wanting to know if he can switch to Viagra . Please reach out to patient when available

## 2024-02-12 MED ORDER — SILDENAFIL CITRATE 100 MG PO TABS: 50.0000 mg | ORAL_TABLET | Freq: Every day | ORAL | 11 refills | Status: DC | PRN

## 2024-02-12 NOTE — Telephone Encounter (Signed)
 Sent in viagra  to try.

## 2024-02-12 NOTE — Telephone Encounter (Signed)
 Left VM informing patient of medication sent

## 2024-02-26 ENCOUNTER — Ambulatory Visit (INDEPENDENT_AMBULATORY_CARE_PROVIDER_SITE_OTHER): Payer: BC Managed Care – PPO | Admitting: Neurology

## 2024-02-26 ENCOUNTER — Encounter: Payer: Self-pay | Admitting: Neurology

## 2024-02-26 VITALS — BP 132/85 | HR 74 | Ht 69.0 in | Wt 196.0 lb

## 2024-02-26 DIAGNOSIS — R4189 Other symptoms and signs involving cognitive functions and awareness: Secondary | ICD-10-CM | POA: Diagnosis not present

## 2024-02-26 DIAGNOSIS — R41840 Attention and concentration deficit: Secondary | ICD-10-CM | POA: Diagnosis not present

## 2024-02-26 DIAGNOSIS — R569 Unspecified convulsions: Secondary | ICD-10-CM

## 2024-02-26 MED ORDER — METHYLPHENIDATE HCL 10 MG PO TABS: 10.0000 mg | ORAL_TABLET | Freq: Every day | ORAL | 0 refills | Status: DC
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
## 2024-02-26 NOTE — Patient Instructions (Addendum)
 Continue current medications  Consider evaluation of Attention Deficit Disorder by the [facility]. Please call and make an appointment [phone] Will also give him a trial of stimulant to see if his symptoms do improve Please call for any questions or concerns.  Return in 6 months or sooner if worse

## 2024-03-11 ENCOUNTER — Ambulatory Visit: Payer: Self-pay | Admitting: Neurology

## 2024-03-14 LAB — ATN PROFILE
A -- Beta-amyloid 42/40 Ratio: 0.118 (ref >0.102)
Beta-amyloid 40: 136.14 pg/mL
Beta-amyloid 42: 16.07 pg/mL
N -- NfL, Plasma: 1.13 pg/mL (ref 0.00–1.69)
T -- p-tau181: 0.41 pg/mL (ref 0.00–0.95)

## 2024-03-14 LAB — APOE ALZHEIMER'S RISK

## 2024-03-14 LAB — VITAMIN B12: Vitamin B-12: 521 pg/mL (ref 232–1245)

## 2024-03-14 LAB — TSH: TSH: 2.61 u[IU]/mL (ref 0.450–4.500)

## 2024-03-25 ENCOUNTER — Other Ambulatory Visit: Payer: Self-pay | Admitting: Neurology

## 2024-03-25 ENCOUNTER — Encounter: Payer: Self-pay | Admitting: Neurology

## 2024-03-25 MED ORDER — METHYLPHENIDATE HCL 10 MG PO TABS: 10.0000 mg | ORAL_TABLET | Freq: Every day | ORAL | 0 refills | Status: AC

## 2024-03-25 NOTE — Telephone Encounter (Signed)
 Requested Prescriptions   Pending Prescriptions Disp Refills   methylphenidate  (RITALIN ) 10 MG tablet 10 tablet 0    Sig: Take 1 tablet (10 mg total) by mouth daily for 10 doses.   Last seen 02/26/24 Next appt scheduled 10/09/24 Dispenses   Dispensed Days Supply Quantity Provider Pharmacy  METHYLPHENIDATE  10 MG TABLET 02/27/2024 10 10 each Gregg Lek, MD [facility] [phone] - [place]

## 2024-03-25 NOTE — Telephone Encounter (Signed)
 Pt is asking that a new Rx  for methylphenidate  (RITALIN ) 10 MG tablet be called into  [facility] [phone]

## 2024-06-02 ENCOUNTER — Ambulatory Visit: Admitting: Internal Medicine

## 2024-06-13 ENCOUNTER — Other Ambulatory Visit: Payer: Self-pay | Admitting: Neurology

## 2024-06-13 ENCOUNTER — Other Ambulatory Visit: Payer: Self-pay | Admitting: Internal Medicine

## 2024-06-16 NOTE — Telephone Encounter (Signed)
 Requested Prescriptions   Pending Prescriptions Disp Refills   clonazePAM  (KLONOPIN ) 0.5 MG tablet [Pharmacy Med Name: CLONAZEPAM  0.5 MG TABLET] 30 tablet     Sig: TAKE 1 TABLET DAILY FOR ANXIETY AS NEEDED. DO NOT EXCEED 1 TABLET IN 24 HRS   Last seen 02/26/24 Next appt 10/09/24 Dispenses   Dispensed Days Supply Quantity Provider Pharmacy  CLONAZEPAM  0.5 MG TABLET 03/30/2024 30 30 each Camara, Amadou, MD [facility] [phone] - [place]...  CLONAZEPAM  0.5 MG TABLET 01/22/2024 30 30 each Camara, Amadou, MD [facility] [phone] - [place]...  CLONAZEPAM  0.5 MG TABLET 11/22/2023 30 30 each Camara, Amadou, MD [facility] [phone] - [place]...  CLONAZEPAM  0.5 MG TABLET 09/14/2023 30 30 each Camara, Amadou, MD [facility] [phone] - [place]...  CLONAZEPAM  0.5 MG TABLET 07/19/2023 30 30 each Camara, Amadou, MD [facility] [phone] - [place]

## 2024-07-08 ENCOUNTER — Other Ambulatory Visit: Payer: Self-pay | Admitting: Internal Medicine

## 2024-07-16 ENCOUNTER — Other Ambulatory Visit: Payer: Self-pay | Admitting: Internal Medicine

## 2024-07-16 NOTE — Telephone Encounter (Signed)
 Copied from CRM #[id number]. Topic: Clinical - Medication Refill >> Jul 16, 2024 12:51 PM Viola F wrote: Medication: omeprazole  (PRILOSEC) 40 MG capsule [[id number]]  tadalafil (CIALIS) 5 MG tablet [Pharmacy Med Name: TADALAFIL 5 MG TABLET] [[id number]] Has the patient contacted their pharmacy? Yes (Agent: If no, request that the patient contact the pharmacy for the refill. If patient does not wish to contact the pharmacy document the reason why and proceed with request.) (Agent: If yes, when and what did the pharmacy advise?)  This is the patient's preferred pharmacy:  [id number] [place], [place] - [address] [place] [place] [place] [place] [place] [id number] Phone: [phone] Fax: [phone]  [facility] [address] [place] [address] Phone: [phone] Fax: [phone]  Is this the correct pharmacy for this prescription? Yes If no, delete pharmacy and type the correct one.   Has the prescription been filled recently? Yes  Is the patient out of the medication? Yes  Has the patient been seen for an appointment in the last year OR does the patient have an upcoming appointment? Yes  Can we respond through MyChart? Yes  Agent: Please be advised that Rx refills may take up to 3 business days. We ask that you follow-up with your pharmacy.

## 2024-07-21 MED ORDER — SILDENAFIL CITRATE 100 MG PO TABS: 50.0000 mg | ORAL_TABLET | Freq: Every day | ORAL | 11 refills | Status: AC | PRN

## 2024-07-21 MED ORDER — OMEPRAZOLE 40 MG PO CPDR: 40.0000 mg | DELAYED_RELEASE_CAPSULE | Freq: Every day | ORAL | 0 refills | Status: AC

## 2024-10-09 ENCOUNTER — Ambulatory Visit: Admitting: Neurology
